# Patient Record
Sex: Female | Born: 1990 | Hispanic: Yes | Marital: Single | Smoking: Never smoker
Health system: Southern US, Community
[De-identification: ages and names within clinical notes are randomized; demographics above are authoritative.]

## PROBLEM LIST (undated history)

## (undated) DIAGNOSIS — F329 Major depressive disorder, single episode, unspecified: Secondary | ICD-10-CM

## (undated) DIAGNOSIS — E119 Type 2 diabetes mellitus without complications: Secondary | ICD-10-CM

## (undated) DIAGNOSIS — K219 Gastro-esophageal reflux disease without esophagitis: Secondary | ICD-10-CM

## (undated) DIAGNOSIS — F32A Depression, unspecified: Secondary | ICD-10-CM

## (undated) DIAGNOSIS — E7800 Pure hypercholesterolemia, unspecified: Secondary | ICD-10-CM

---

## 2011-03-29 ENCOUNTER — Emergency Department: Payer: Self-pay | Admitting: Emergency Medicine

## 2011-04-08 ENCOUNTER — Emergency Department: Payer: Self-pay | Admitting: Emergency Medicine

## 2011-10-19 ENCOUNTER — Emergency Department: Payer: Self-pay | Admitting: Emergency Medicine

## 2012-05-28 ENCOUNTER — Emergency Department: Payer: Self-pay | Admitting: Unknown Physician Specialty

## 2012-05-28 LAB — COMPREHENSIVE METABOLIC PANEL
Albumin: 4.1 g/dL (ref 3.4–5.0)
BUN: 11 mg/dL (ref 7–18)
Chloride: 107 mmol/L (ref 98–107)
Co2: 28 mmol/L (ref 21–32)
EGFR (African American): >60
EGFR (Non-African Amer.): >60
Glucose: 87 mg/dL (ref 65–99)
SGOT(AST): 24 U/L (ref 15–37)
SGPT (ALT): 39 U/L (ref 12–78)
Total Protein: 7.8 g/dL (ref 6.4–8.2)

## 2012-05-28 LAB — CBC
HCT: 39.6 % (ref 35.0–47.0)
HGB: 13.3 g/dL (ref 12.0–16.0)
MCHC: 33.6 g/dL (ref 32.0–36.0)
MCV: 80 fL (ref 80–100)
RBC: 4.98 10*6/uL (ref 3.80–5.20)
RDW: 15 % — ABNORMAL HIGH (ref 11.5–14.5)

## 2012-05-28 LAB — URINALYSIS, COMPLETE
Blood: NEGATIVE
Glucose,UR: NEGATIVE mg/dL (ref 0–75)
Ketone: NEGATIVE
Nitrite: NEGATIVE
Ph: 6 (ref 4.5–8.0)
Protein: NEGATIVE
Specific Gravity: 1.029 (ref 1.003–1.030)
Squamous Epithelial: 5
WBC UR: 11 /HPF (ref 0–5)

## 2012-05-28 LAB — LIPASE, BLOOD: Lipase: 117 U/L (ref 73–393)

## 2012-11-14 ENCOUNTER — Encounter (HOSPITAL_COMMUNITY): Payer: Self-pay | Admitting: *Deleted

## 2012-11-14 ENCOUNTER — Emergency Department (HOSPITAL_COMMUNITY): Payer: BC Managed Care – PPO

## 2012-11-14 ENCOUNTER — Emergency Department (HOSPITAL_COMMUNITY)
Admission: EM | Admit: 2012-11-14 | Discharge: 2012-11-14 | Disposition: A | Discharge status: Home / Self Care | Payer: BC Managed Care – PPO | Attending: Emergency Medicine | Admitting: Emergency Medicine

## 2012-11-14 DIAGNOSIS — Z3202 Encounter for pregnancy test, result negative: Secondary | ICD-10-CM | POA: Insufficient documentation

## 2012-11-14 DIAGNOSIS — Z79899 Other long term (current) drug therapy: Secondary | ICD-10-CM | POA: Insufficient documentation

## 2012-11-14 DIAGNOSIS — F3289 Other specified depressive episodes: Secondary | ICD-10-CM | POA: Insufficient documentation

## 2012-11-14 DIAGNOSIS — E119 Type 2 diabetes mellitus without complications: Secondary | ICD-10-CM | POA: Insufficient documentation

## 2012-11-14 DIAGNOSIS — F329 Major depressive disorder, single episode, unspecified: Secondary | ICD-10-CM | POA: Insufficient documentation

## 2012-11-14 DIAGNOSIS — E669 Obesity, unspecified: Secondary | ICD-10-CM | POA: Insufficient documentation

## 2012-11-14 DIAGNOSIS — K219 Gastro-esophageal reflux disease without esophagitis: Secondary | ICD-10-CM | POA: Insufficient documentation

## 2012-11-14 DIAGNOSIS — R110 Nausea: Secondary | ICD-10-CM | POA: Insufficient documentation

## 2012-11-14 DIAGNOSIS — IMO0001 Reserved for inherently not codable concepts without codable children: Secondary | ICD-10-CM

## 2012-11-14 DIAGNOSIS — E7800 Pure hypercholesterolemia, unspecified: Secondary | ICD-10-CM | POA: Insufficient documentation

## 2012-11-14 DIAGNOSIS — R109 Unspecified abdominal pain: Secondary | ICD-10-CM | POA: Insufficient documentation

## 2012-11-14 HISTORY — DX: Type 2 diabetes mellitus without complications: E11.9

## 2012-11-14 HISTORY — DX: Gastro-esophageal reflux disease without esophagitis: K21.9

## 2012-11-14 HISTORY — DX: Pure hypercholesterolemia, unspecified: E78.00

## 2012-11-14 HISTORY — DX: Major depressive disorder, single episode, unspecified: F32.9

## 2012-11-14 HISTORY — DX: Depression, unspecified: F32.A

## 2012-11-14 LAB — COMPREHENSIVE METABOLIC PANEL
ALT: 48 U/L — ABNORMAL HIGH (ref 0–35)
Alkaline Phosphatase: 75 U/L (ref 39–117)
CO2: 24 mEq/L (ref 19–32)
Calcium: 10.2 mg/dL (ref 8.4–10.5)
Chloride: 103 mEq/L (ref 96–112)
GFR calc Af Amer: >90 mL/min (ref >90)
GFR calc non Af Amer: >90 mL/min (ref >90)
Glucose, Bld: 84 mg/dL (ref 70–99)
Potassium: 4 mEq/L (ref 3.5–5.1)
Sodium: 138 mEq/L (ref 135–145)
Total Bilirubin: 0.3 mg/dL (ref 0.3–1.2)

## 2012-11-14 LAB — URINE MICROSCOPIC-ADD ON

## 2012-11-14 LAB — URINALYSIS, ROUTINE W REFLEX MICROSCOPIC
Bilirubin Urine: NEGATIVE
Hgb urine dipstick: NEGATIVE
Ketones, ur: NEGATIVE mg/dL
Nitrite: NEGATIVE
Protein, ur: NEGATIVE mg/dL
Urobilinogen, UA: 0.2 mg/dL (ref 0.0–1.0)

## 2012-11-14 MED ORDER — GI COCKTAIL ~~LOC~~
30.0000 mL | Freq: Once | ORAL | Status: AC
  Administered 2012-11-14: 30 mL via ORAL
  Filled 2012-11-14 (×2): qty 30

## 2012-11-14 MED ORDER — FAMOTIDINE 20 MG PO TABS
20.0000 mg | ORAL_TABLET | Freq: Once | ORAL | Status: AC
  Administered 2012-11-14: 20 mg via ORAL
  Filled 2012-11-14: qty 1

## 2012-11-14 NOTE — ED Provider Notes (Signed)
History     CSN: [id number]  Arrival date & time 11/14/12  1300   First MD Initiated Contact with Patient 11/14/12 1329      Chief Complaint  Patient presents with  . Gastrophageal Reflux    (Consider location/radiation/quality/duration/timing/severity/associated sxs/prior treatment) The history is provided by the patient.  Rachael Bell is a 22 y.o. female history of diabetes, gastric reflux here presenting with worsening reflux symptoms. She was diagnosed with gastric reflux of month ago and was started on a medicine but she said didn't help so she stopped taking it. The last month she been having pain when she swallows and she felt nauseous after she eats. She felt like she couldn't vomit but she did not vomit. She also felt that the food is not going down as easily and now she can be spicy food. Denies any fevers or chills or constipation diarrhea or urinary symptoms. Never had GI workup in the past.    Past Medical History  Diagnosis Date  . GERD (gastroesophageal reflux disease)   . Diabetes mellitus without complication     non-insulin dependant  . Hypercholesteremia   . Depression     History reviewed. No pertinent past surgical history.  No family history on file.  History  Substance Use Topics  . Smoking status: Never Smoker   . Smokeless tobacco: Not on file  . Alcohol Use: No    OB History   Grav Para Term Preterm Abortions TAB SAB Ect Mult Living                  Review of Systems  Gastrointestinal: Positive for nausea and abdominal pain.  All other systems reviewed and are negative.    Allergies  Review of patient's allergies indicates no known allergies.  Home Medications   Current Outpatient Rx  Name  Route  Sig  Dispense  Refill  . metFORMIN (GLUCOPHAGE) 500 MG tablet   Oral   Take 500 mg by mouth 2 (two) times daily with a meal.         . simvastatin (ZOCOR) 20 MG tablet   Oral   Take 20 mg by mouth every evening.            BP 120/69  Pulse 73  Temp(Src) 98.3 F (36.8 C) (Oral)  SpO2 97%  LMP 10/31/2012  Physical Exam  Nursing note and vitals reviewed. Constitutional: She is oriented to person, place, and time. She appears well-developed and well-nourished.  obese  HENT:  Head: Normocephalic.  Mouth/Throat: Oropharynx is clear and moist.  Eyes: Conjunctivae are normal. Pupils are equal, round, and reactive to light.  Neck: Normal range of motion. Neck supple.  Cardiovascular: Normal rate, regular rhythm and normal heart sounds.   Pulmonary/Chest: Effort normal and breath sounds normal. No respiratory distress. She has no wheezes. She has no rales.  Abdominal: Soft. Bowel sounds are normal. She exhibits no distension. There is no tenderness. There is no rebound.  Musculoskeletal: Normal range of motion.  Neurological: She is alert and oriented to person, place, and time.  Skin: Skin is warm and dry.  Psychiatric: She has a normal mood and affect. Her behavior is normal. Judgment and thought content normal.    ED Course  Procedures (including critical care time)  Labs Reviewed  COMPREHENSIVE METABOLIC PANEL - Abnormal; Notable for the following:    Creatinine, Ser 0.45 (*)    AST 38 (*)    ALT 48 (*)    All  other components within normal limits  URINALYSIS, ROUTINE W REFLEX MICROSCOPIC - Abnormal; Notable for the following:    APPearance CLOUDY (*)    Leukocytes, UA MODERATE (*)    All other components within normal limits  PREGNANCY, URINE  LIPASE, BLOOD  URINE MICROSCOPIC-ADD ON  LIPASE, BLOOD  CBC WITH DIFFERENTIAL   Dg Neck Soft Tissue  11/14/2012  *RADIOLOGY REPORT*  Clinical Data: Dysphasia.  NECK SOFT TISSUES - 1+ VIEW  Comparison: No priors.  Findings: AP and lateral soft tissue views of the neck demonstrate a normal appearance of the hypopharynx, epiglottis and subglottic airway.  Prevertebral soft tissues are normal.  IMPRESSION: 1.  No acute radiographic abnormality of the  soft tissues of the neck to account for the patient's symptoms.   Original Report Authenticated By: Trudie Reed, M.D.    Dg Chest 2 View  11/14/2012  *RADIOLOGY REPORT*  Clinical Data: Dysphagia  CHEST - 2 VIEW  Comparison: None.  Findings:  Lungs clear.  Heart size and pulmonary vascularity are normal.  No adenopathy.  No bone lesions.  No pneumothorax.  IMPRESSION: No abnormality noted.   Original Report Authenticated By: Bretta Bang, M.D.      1. Reflux       MDM  Rachael Bell is a 22 y.o. female here with nausea worse with eating. Likely reflux. No abdominal tenderness. Will check labs and give GI cocktail and reassess.   3:21 PM Felt well. Still feels tough time swallowing but able to swallow. Xrays nl. LFTs mildly elevated but abdomen nontender. She has PMD f/u tomorrow. I told her that she needs to see GI for endoscopy.         Richardean Canal, MD 11/14/12 [phone]

## 2012-11-14 NOTE — ED Notes (Signed)
Patient transported to X-ray 

## 2012-11-14 NOTE — ED Notes (Signed)
Pt with hx of acid reflux to ED c/o sob and throat/sternal pain every time she eats x 1 month.  This last week she feels like she is going to choke or aspirate every time she eats.

## 2013-05-12 ENCOUNTER — Encounter (HOSPITAL_COMMUNITY): Payer: Self-pay | Admitting: Emergency Medicine

## 2013-05-12 ENCOUNTER — Emergency Department (HOSPITAL_COMMUNITY)
Admission: EM | Admit: 2013-05-12 | Discharge: 2013-05-12 | Disposition: A | Discharge status: Home / Self Care | Payer: BC Managed Care – PPO | Attending: Emergency Medicine | Admitting: Emergency Medicine

## 2013-05-12 DIAGNOSIS — Z79899 Other long term (current) drug therapy: Secondary | ICD-10-CM | POA: Insufficient documentation

## 2013-05-12 DIAGNOSIS — Z3202 Encounter for pregnancy test, result negative: Secondary | ICD-10-CM | POA: Insufficient documentation

## 2013-05-12 DIAGNOSIS — E119 Type 2 diabetes mellitus without complications: Secondary | ICD-10-CM | POA: Insufficient documentation

## 2013-05-12 DIAGNOSIS — R51 Headache: Secondary | ICD-10-CM | POA: Insufficient documentation

## 2013-05-12 DIAGNOSIS — R519 Headache, unspecified: Secondary | ICD-10-CM

## 2013-05-12 DIAGNOSIS — E7800 Pure hypercholesterolemia, unspecified: Secondary | ICD-10-CM | POA: Insufficient documentation

## 2013-05-12 DIAGNOSIS — K219 Gastro-esophageal reflux disease without esophagitis: Secondary | ICD-10-CM | POA: Insufficient documentation

## 2013-05-12 DIAGNOSIS — Z8659 Personal history of other mental and behavioral disorders: Secondary | ICD-10-CM | POA: Insufficient documentation

## 2013-05-12 DIAGNOSIS — R42 Dizziness and giddiness: Secondary | ICD-10-CM | POA: Insufficient documentation

## 2013-05-12 LAB — URINALYSIS, ROUTINE W REFLEX MICROSCOPIC
Bilirubin Urine: NEGATIVE
Glucose, UA: NEGATIVE mg/dL
Hgb urine dipstick: NEGATIVE
Ketones, ur: NEGATIVE mg/dL
Nitrite: NEGATIVE
Protein, ur: NEGATIVE mg/dL
Specific Gravity, Urine: 1.014 (ref 1.005–1.030)
Urobilinogen, UA: 0.2 mg/dL (ref 0.0–1.0)
pH: 7.5 (ref 5.0–8.0)

## 2013-05-12 LAB — POCT I-STAT, CHEM 8
Creatinine, Ser: 0.6 mg/dL (ref 0.50–1.10)
Glucose, Bld: 110 mg/dL — ABNORMAL HIGH (ref 70–99)
HCT: 41 % (ref 36.0–46.0)
Hemoglobin: 13.9 g/dL (ref 12.0–15.0)
Potassium: 3.6 mEq/L (ref 3.5–5.1)
Sodium: 141 mEq/L (ref 135–145)
TCO2: 27 mmol/L (ref 0–100)

## 2013-05-12 LAB — URINE MICROSCOPIC-ADD ON

## 2013-05-12 MED ORDER — KETOROLAC TROMETHAMINE 60 MG/2ML IM SOLN
60.0000 mg | Freq: Once | INTRAMUSCULAR | Status: DC
  Filled 2013-05-12: qty 2

## 2013-05-12 MED ORDER — IBUPROFEN 800 MG PO TABS
800.0000 mg | ORAL_TABLET | Freq: Once | ORAL | Status: AC
  Administered 2013-05-12: 800 mg via ORAL
  Filled 2013-05-12: qty 1

## 2013-05-12 MED ORDER — IBUPROFEN 800 MG PO TABS: 800.0000 mg | ORAL_TABLET | Freq: Three times a day (TID) | ORAL | Status: DC | PRN

## 2013-05-12 NOTE — ED Notes (Signed)
Pt reports headaches on and off the last week. States she has pressure in the top of her head. Also reports dizziness while working. Pt also c/o pain in upper back. Pt awake, alert, oriented x4, neuro intact.

## 2013-05-12 NOTE — ED Provider Notes (Signed)
Medical screening examination/treatment/procedure(s) were performed by non-physician practitioner and as supervising physician I was immediately available for consultation/collaboration.  Darlys Gales, MD 05/12/13 2228

## 2013-05-12 NOTE — ED Provider Notes (Signed)
CSN: [id number]     Arrival date & time 05/12/13  1140 History     First MD Initiated Contact with Patient 05/12/13 1159     Chief Complaint  Patient presents with  . Headache  . Dizziness   (Consider location/radiation/quality/duration/timing/severity/associated sxs/prior Treatment) HPI Patient presents emergency department with off-and-on headache for the last 7 days.  Patient, states, that she has headache, with bilateral and nonradiating, states, at, times she'll have pain, and headache behind both eyes.  Patient denies blurred vision, weakness, dizziness, neck pain, numbness, chest pain, shortness of breath, fever, abdominal pain, or syncope.  Patient, states, that is not normally, headaches, has had headaches in the past.  Patient denies photophobia.  Patient, states nothing seems to make her condition, better or worse. Past Medical History  Diagnosis Date  . GERD (gastroesophageal reflux disease)   . Diabetes mellitus without complication     non-insulin dependant  . Hypercholesteremia   . Depression    History reviewed. No pertinent past surgical history. History reviewed. No pertinent family history. History  Substance Use Topics  . Smoking status: Never Smoker   . Smokeless tobacco: Not on file  . Alcohol Use: No   OB History   Grav Para Term Preterm Abortions TAB SAB Ect Mult Living                 Review of Systems All other systems negative except as documented in the HPI. All pertinent positives and negatives as reviewed in the HPI. Allergies  Review of patient's allergies indicates no known allergies.  Home Medications   Current Outpatient Rx  Name  Route  Sig  Dispense  Refill  . metFORMIN (GLUCOPHAGE) 500 MG tablet   Oral   Take 500 mg by mouth 2 (two) times daily with a meal.         . pantoprazole (PROTONIX) 40 MG tablet   Oral   Take 40 mg by mouth daily.         . simvastatin (ZOCOR) 20 MG tablet   Oral   Take 20 mg by mouth every  evening.          BP 114/65  Pulse 92  Temp(Src) 98.3 F (36.8 C) (Oral)  Resp 16  Wt 174 lb (78.926 kg)  SpO2 98%  LMP 04/28/2013 Physical Exam  Constitutional: She is oriented to person, place, and time. She appears well-developed and well-nourished. No distress.  HENT:  Head: Normocephalic and atraumatic.  Mouth/Throat: Oropharynx is clear and moist.  Eyes: Pupils are equal, round, and reactive to light.  Neck: Normal range of motion. Neck supple.  Cardiovascular: Normal rate, regular rhythm and normal heart sounds.  Exam reveals no gallop and no friction rub.   No murmur heard. Pulmonary/Chest: Effort normal and breath sounds normal. No respiratory distress.  Lymphadenopathy:    She has no cervical adenopathy.  Neurological: She is alert and oriented to person, place, and time. She has normal strength. No cranial nerve deficit or sensory deficit. She exhibits normal muscle tone. Coordination and gait normal. GCS eye subscore is 4. GCS verbal subscore is 5. GCS motor subscore is 6.  Skin: Skin is warm and dry.    ED Course   Procedures (including critical care time)  Labs Reviewed  URINALYSIS, ROUTINE W REFLEX MICROSCOPIC - Abnormal; Notable for the following:    Leukocytes, UA TRACE (*)    All other components within normal limits  URINE MICROSCOPIC-ADD ON - Abnormal; Notable for  the following:    Squamous Epithelial / LPF FEW (*)    All other components within normal limits  POCT I-STAT, CHEM 8 - Abnormal; Notable for the following:    BUN 5 (*)    Glucose, Bld 110 (*)    Calcium, Ion 1.24 (*)    All other components within normal limits  POCT PREGNANCY, URINE   patient does not have any neurological deficits on exam.  She is currently not having any headache in the emergency department.  I advised the patient.  Followup with her primary care Dr. for further evaluation and basic lab tests were normal here today  MDM    Carlyle Dolly, PA-C 05/12/13  1451

## 2013-07-13 ENCOUNTER — Encounter (HOSPITAL_COMMUNITY): Payer: Self-pay | Admitting: Emergency Medicine

## 2013-07-13 ENCOUNTER — Emergency Department (HOSPITAL_COMMUNITY)
Admission: EM | Admit: 2013-07-13 | Discharge: 2013-07-13 | Disposition: A | Discharge status: Home / Self Care | Payer: BC Managed Care – PPO | Attending: Emergency Medicine | Admitting: Emergency Medicine

## 2013-07-13 DIAGNOSIS — Z79899 Other long term (current) drug therapy: Secondary | ICD-10-CM | POA: Insufficient documentation

## 2013-07-13 DIAGNOSIS — Z8719 Personal history of other diseases of the digestive system: Secondary | ICD-10-CM | POA: Insufficient documentation

## 2013-07-13 DIAGNOSIS — E7800 Pure hypercholesterolemia, unspecified: Secondary | ICD-10-CM | POA: Insufficient documentation

## 2013-07-13 DIAGNOSIS — L509 Urticaria, unspecified: Secondary | ICD-10-CM

## 2013-07-13 DIAGNOSIS — Z8659 Personal history of other mental and behavioral disorders: Secondary | ICD-10-CM | POA: Insufficient documentation

## 2013-07-13 DIAGNOSIS — E119 Type 2 diabetes mellitus without complications: Secondary | ICD-10-CM | POA: Insufficient documentation

## 2013-07-13 MED ORDER — HYDROCORTISONE 1 % EX CREA: TOPICAL_CREAM | CUTANEOUS | Status: DC

## 2013-07-13 MED ORDER — FAMOTIDINE 20 MG PO TABS
20.0000 mg | ORAL_TABLET | Freq: Once | ORAL | Status: AC
  Administered 2013-07-13: 20 mg via ORAL
  Filled 2013-07-13: qty 1

## 2013-07-13 MED ORDER — DIPHENHYDRAMINE HCL 25 MG PO CAPS
25.0000 mg | ORAL_CAPSULE | Freq: Once | ORAL | Status: AC
  Administered 2013-07-13: 25 mg via ORAL
  Filled 2013-07-13: qty 1

## 2013-07-13 MED ORDER — LORATADINE 10 MG PO TABS: 10.0000 mg | ORAL_TABLET | Freq: Every day | ORAL | Status: DC

## 2013-07-13 MED ORDER — DIPHENHYDRAMINE HCL 25 MG PO TABS: 25.0000 mg | ORAL_TABLET | Freq: Four times a day (QID) | ORAL | Status: DC

## 2013-07-13 MED ORDER — LORATADINE 10 MG PO TABS
10.0000 mg | ORAL_TABLET | Freq: Once | ORAL | Status: AC
  Administered 2013-07-13: 10 mg via ORAL
  Filled 2013-07-13: qty 1

## 2013-07-13 NOTE — ED Provider Notes (Signed)
Medical screening examination/treatment/procedure(s) were performed by non-physician practitioner and as supervising physician I was immediately available for consultation/collaboration.   Layla Maw Ward, DO 07/13/13 [phone]

## 2013-07-13 NOTE — ED Notes (Signed)
Pt. reports generalized itchy rashes onset yesterday , respirations unlabored /airway intact.

## 2013-07-13 NOTE — ED Provider Notes (Signed)
CSN: [id number]     Arrival date & time 07/13/13  0013 History   First MD Initiated Contact with Patient 07/13/13 0052     Chief Complaint  Patient presents with  . Rash   (Consider location/radiation/quality/duration/timing/severity/associated sxs/prior Treatment) HPI  Rachael Bell is a 22 y.o.female with a significant PMH of gerd, diabetes, depression and high cholesterol presents to the ER with complaints of rash to arms chest and abdomen. Patient woke up this morning noticing the rash which is itchy. She went to work without any problems. When her husband saw the rash this evening he advised her to come to the emergency department to get evaluated. It does not hurt but feels itchy. He says that she has had allergic reaction rash like this before but a long time ago to poison ivy. She is unsure of what she is allergic to. He has not had any wheezing, shortness of breath, facial, tongue or lip swelling. She has not tried to use any medication for the rash. nad vss   Past Medical History  Diagnosis Date  . GERD (gastroesophageal reflux disease)   . Diabetes mellitus without complication     non-insulin dependant  . Hypercholesteremia   . Depression    History reviewed. No pertinent past surgical history. No family history on file. History  Substance Use Topics  . Smoking status: Never Smoker   . Smokeless tobacco: Not on file  . Alcohol Use: No   OB History   Grav Para Term Preterm Abortions TAB SAB Ect Mult Living                 Review of Systems  Review of Systems  Gen: no weight loss, fevers, chills, night sweats  Eyes: no discharge or drainage, no occular pain or visual changes  Nose: no epistaxis or rhinorrhea  Mouth: no dental pain, no sore throat  Neck: no neck pain  Lungs:No wheezing, coughing or hemoptysis CV: no chest pain, palpitations, dependent edema or orthopnea  Abd: no abdominal pain, nausea, vomiting  GU: no dysuria or gross hematuria  MSK:  No  abnormalities  Neuro: no headache, no focal neurologic deficits  Skin: + rash Psyche: negative.    Allergies  Review of patient's allergies indicates no known allergies.  Home Medications   Current Outpatient Rx  Name  Route  Sig  Dispense  Refill  . butalbital-acetaminophen-caffeine (FIORICET, ESGIC) 50-325-40 MG per tablet   Oral   Take 1 tablet by mouth every 4 (four) hours as needed for headache.         . metFORMIN (GLUCOPHAGE) 500 MG tablet   Oral   Take 500 mg by mouth 2 (two) times daily with a meal.         . simvastatin (ZOCOR) 20 MG tablet   Oral   Take 20 mg by mouth every evening.         . diphenhydrAMINE (BENADRYL) 25 MG tablet   Oral   Take 1 tablet (25 mg total) by mouth every 6 (six) hours.   20 tablet   0   . hydrocortisone cream 1 %      Apply to affected area 2 times daily   15 g   0   . loratadine (CLARITIN) 10 MG tablet   Oral   Take 1 tablet (10 mg total) by mouth daily.   14 tablet   0    BP 111/66  Pulse 82  Temp(Src) 98.8 F (37.1 C) (Oral)  Resp 16  Ht 5\' 3"  (1.6 m)  Wt 186 lb (84.369 kg)  BMI 32.96 kg/m2  SpO2 99%  LMP 06/17/2013 Physical Exam  Nursing note and vitals reviewed. Constitutional: She appears well-developed and well-nourished. No distress.  HENT:  Head: Normocephalic and atraumatic.  Mouth/Throat: Uvula is midline, oropharynx is clear and moist and mucous membranes are normal.  Eyes: Pupils are equal, round, and reactive to light.  Neck: Normal range of motion. Neck supple.  Cardiovascular: Normal rate and regular rhythm.   Pulmonary/Chest: Effort normal.  Abdominal: Soft.  Neurological: She is alert.  Skin: Skin is warm and dry. Rash noted. Rash is urticarial.       ED Course  Procedures (including critical care time) Labs Review Labs Reviewed - No data to display Imaging Review No results found.  EKG Interpretation   None       MDM   1. Hives      Due to patient being a  diabetic I will default to topical hydrocortisone cream instead of prednisone. Symptoms are mild with systemic involvement.  diphenhydrAMINE (BENADRYL) 25 MG tablet Take 1 tablet (25 mg total) by mouth every 6 (six) hours. 20 tablet Dorthula Matas, PA-C hydrocortisone cream 1 % Apply to affected area 2 times daily 15 g Sherica Paternostro G Zyaira Vejar, PA-C loratadine (CLARITIN) 10 MG tablet Take 1 tablet (10 mg total) by mouth daily. 14 tablet  22 y.o.[name] evaluation in the Emergency Department is complete. It has been determined that no acute conditions requiring further emergency intervention are present at this time. The patient/guardian have been advised of the diagnosis and plan. We have discussed signs and symptoms that warrant return to the ED, such as changes or worsening in symptoms.  Vital signs are stable at discharge. Filed Vitals:   07/13/13 0025  BP: 111/66  Pulse: 82  Temp: 98.8 F (37.1 C)  Resp: 16    Patient/guardian has voiced understanding and agreed to follow-up with the PCP or specialist.     Dorthula Matas, PA-C 07/13/13 0145

## 2013-07-19 ENCOUNTER — Encounter (HOSPITAL_COMMUNITY): Payer: Self-pay | Admitting: Emergency Medicine

## 2013-07-19 ENCOUNTER — Emergency Department (HOSPITAL_COMMUNITY)
Admission: EM | Admit: 2013-07-19 | Discharge: 2013-07-19 | Disposition: A | Discharge status: Home / Self Care | Payer: BC Managed Care – PPO | Attending: Emergency Medicine | Admitting: Emergency Medicine

## 2013-07-19 DIAGNOSIS — R5381 Other malaise: Secondary | ICD-10-CM | POA: Insufficient documentation

## 2013-07-19 DIAGNOSIS — R29898 Other symptoms and signs involving the musculoskeletal system: Secondary | ICD-10-CM

## 2013-07-19 DIAGNOSIS — Z8659 Personal history of other mental and behavioral disorders: Secondary | ICD-10-CM | POA: Insufficient documentation

## 2013-07-19 DIAGNOSIS — Z79899 Other long term (current) drug therapy: Secondary | ICD-10-CM | POA: Insufficient documentation

## 2013-07-19 DIAGNOSIS — E7800 Pure hypercholesterolemia, unspecified: Secondary | ICD-10-CM | POA: Insufficient documentation

## 2013-07-19 DIAGNOSIS — Z8719 Personal history of other diseases of the digestive system: Secondary | ICD-10-CM | POA: Insufficient documentation

## 2013-07-19 DIAGNOSIS — E119 Type 2 diabetes mellitus without complications: Secondary | ICD-10-CM | POA: Insufficient documentation

## 2013-07-19 LAB — CBC WITH DIFFERENTIAL/PLATELET
Basophils Relative: 0 % (ref 0–1)
Eosinophils Absolute: 0 10*3/uL (ref 0.0–0.7)
Hemoglobin: 12.2 g/dL (ref 12.0–15.0)
Lymphocytes Relative: 26 % (ref 12–46)
Lymphs Abs: 1.4 10*3/uL (ref 0.7–4.0)
MCH: 25.6 pg — ABNORMAL LOW (ref 26.0–34.0)
MCV: 77.7 fL — ABNORMAL LOW (ref 78.0–100.0)
Monocytes Relative: 7 % (ref 3–12)
Neutrophils Relative %: 66 % (ref 43–77)
Platelets: 175 10*3/uL (ref 150–400)
RBC: 4.76 MIL/uL (ref 3.87–5.11)
WBC: 5.4 10*3/uL (ref 4.0–10.5)

## 2013-07-19 LAB — POCT I-STAT, CHEM 8
BUN: 7 mg/dL (ref 6–23)
Creatinine, Ser: 0.8 mg/dL (ref 0.50–1.10)
Glucose, Bld: 96 mg/dL (ref 70–99)
HCT: 39 % (ref 36.0–46.0)
Hemoglobin: 13.3 g/dL (ref 12.0–15.0)
Potassium: 3.7 mEq/L (ref 3.5–5.1)
Sodium: 143 mEq/L (ref 135–145)

## 2013-07-19 MED ORDER — ALPRAZOLAM 0.25 MG PO TABS
0.5000 mg | ORAL_TABLET | Freq: Once | ORAL | Status: AC
  Administered 2013-07-19: 0.5 mg via ORAL
  Filled 2013-07-19: qty 2

## 2013-07-19 MED ORDER — ALPRAZOLAM 0.5 MG PO TABS: 0.5000 mg | ORAL_TABLET | Freq: Every evening | ORAL | Status: DC | PRN

## 2013-07-19 NOTE — ED Provider Notes (Signed)
CSN: [id number]     Arrival date & time 07/19/13  1141 History   None    Chief Complaint  Patient presents with  . Headache   (Consider location/radiation/quality/duration/timing/severity/associated sxs/prior Treatment) Patient is a 22 y.o. female presenting with headaches. No language interpreter was used.  Headache Pain location:  L temporal and R temporal Quality:  Dull Radiates to:  Does not radiate Onset quality:  Gradual Associated symptoms: weakness   Associated symptoms: no congestion, no cough, no fever, no myalgias, no numbness and no paresthesias   Weakness:    Severity:  Mild   Onset quality:  Gradual   Duration:  2 days   Timing:  Intermittent Risk factors: lifestyle not sedentary   Pt is a 22 year old female who presents with a history of tension headaches and overall feeling of tiredness and weakness in her legs. She has had some follow-up for her headaches and a recent MRI. 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 She also reports that she has not been eating consistent meals just because she has been so busy. She takes her Metformin in the morning with breakfast because it sometimes causes her some GI upset. She denies chest pain, shortness of breath or difficulty breathing. She denies history of recent illness or sick exposure. No fever, chills, nausea or vomiting. She denies having a headache at this time.    Past Medical History  Diagnosis Date  . GERD (gastroesophageal reflux disease)   . Diabetes mellitus without complication     non-insulin dependant  . Hypercholesteremia   . Depression    History reviewed. No pertinent past surgical history. History reviewed. No pertinent family history. History  Substance Use Topics  . Smoking status: Never Smoker   . Smokeless tobacco: Not on file  . Alcohol Use: No   OB History   Grav Para Term Preterm Abortions TAB SAB Ect  Mult Living                 Review of Systems  Constitutional: Negative for fever and chills.  HENT: Negative for congestion.   Respiratory: Negative for cough, chest tightness, shortness of breath and wheezing.   Musculoskeletal: Negative for gait problem, joint swelling and myalgias.  Neurological: Positive for headaches. Negative for weakness, light-headedness, numbness and paresthesias.  All other systems reviewed and are negative.    Allergies  Review of patient's allergies indicates no known allergies.  Home Medications   Current Outpatient Rx  Name  Route  Sig  Dispense  Refill  . butalbital-acetaminophen-caffeine (FIORICET, ESGIC) 50-325-40 MG per tablet   Oral   Take 1 tablet by mouth every 4 (four) hours as needed for headache.         . levonorgestrel (MIRENA) 20 MCG/24HR IUD   Intrauterine   1 each by Intrauterine route once.         . metFORMIN (GLUCOPHAGE) 500 MG tablet   Oral   Take 500 mg by mouth 2 (two) times daily with a meal.         . simvastatin (ZOCOR) 20 MG tablet   Oral   Take 20 mg by mouth every evening.          BP 138/80  Pulse 104  Temp(Src) 98.7 F (37.1 C) (Oral)  Resp 20  Wt 186 lb (84.369 kg)  BMI 32.96 kg/m2  SpO2 100%  LMP  06/17/2013 Physical Exam  Nursing note and vitals reviewed. Constitutional: She is oriented to person, place, and time. She appears well-developed and well-nourished. No distress.  HENT:  Head: Normocephalic and atraumatic.  Right Ear: External ear normal.  Left Ear: External ear normal.  Mouth/Throat: Oropharynx is clear and moist.  Eyes: Conjunctivae and EOM are normal. Pupils are equal, round, and reactive to light.  Neck: Normal range of motion. Neck supple. No JVD present. No thyromegaly present.  Cardiovascular: Normal rate, regular rhythm, normal heart sounds and intact distal pulses.   Pulmonary/Chest: Effort normal and breath sounds normal. No respiratory distress. She has no wheezes.   Abdominal: Soft. Bowel sounds are normal. She exhibits no distension. There is no tenderness.  Musculoskeletal: Normal range of motion.  Lymphadenopathy:    She has no cervical adenopathy.  Neurological: She is alert and oriented to person, place, and time. She has normal strength. No cranial nerve deficit or sensory deficit. She displays a negative Romberg sign. Coordination and gait normal. GCS eye subscore is 4. GCS verbal subscore is 5. GCS motor subscore is 6.  Skin: Skin is warm and dry.  Psychiatric: She has a normal mood and affect. Her behavior is normal. Judgment and thought content normal.    ED Course  Procedures (including critical care time) Labs Review Labs Reviewed - No data to display Imaging Review No results found.  EKG Interpretation   None       MDM   1. Leg weakness    Pt has a job where she stands hunched over for long periods of time because of the nature of the work that she does. She reports that she feels like some of her leg weakness may be due to fatigue from standing for long periods in the same position. Lab work was unremarkable. No numbness, tingling or disruptions in her gait. She also has tension headaches that are believed to be posiional and related to her work. Encouraged to do stretching exercises, warm compresses and frequent change of position to avoid fatigue and muscle spasm.      Irish Elders, NP 07/19/13 1515

## 2013-07-19 NOTE — ED Notes (Signed)
Pt sts that she has been standing constantly at work this past week and wonders if this could be from that.

## 2013-07-19 NOTE — ED Notes (Signed)
Pt in c/o headache since last night and also bilateral leg weakness, pt ambulatory to room, pt states she feels more tired, pt has been recently evaluated for same and had MRI that was normal, pt is supposed to follow up with her PMD

## 2013-07-19 NOTE — ED Notes (Signed)
Pt dc to home. Pt sts understanding to dc instructions. Pt ambulatory to exit without difficulty. 

## 2013-07-19 NOTE — ED Notes (Signed)
Pt check: pt sts feeling much better.  md aware.

## 2013-07-22 NOTE — ED Provider Notes (Signed)
Medical screening examination/treatment/procedure(s) were performed by non-physician practitioner and as supervising physician I was immediately available for consultation/collaboration.  EKG Interpretation   None         Philander Ake M August Gosser, DO 07/22/13 1139 

## 2013-08-19 ENCOUNTER — Encounter (HOSPITAL_COMMUNITY): Payer: Self-pay | Admitting: Emergency Medicine

## 2013-08-19 ENCOUNTER — Emergency Department (HOSPITAL_COMMUNITY)
Admission: EM | Admit: 2013-08-19 | Discharge: 2013-08-19 | Disposition: A | Discharge status: Home / Self Care | Payer: BC Managed Care – PPO | Attending: Emergency Medicine | Admitting: Emergency Medicine

## 2013-08-19 DIAGNOSIS — Z79899 Other long term (current) drug therapy: Secondary | ICD-10-CM | POA: Insufficient documentation

## 2013-08-19 DIAGNOSIS — R110 Nausea: Secondary | ICD-10-CM | POA: Insufficient documentation

## 2013-08-19 DIAGNOSIS — Z8719 Personal history of other diseases of the digestive system: Secondary | ICD-10-CM | POA: Insufficient documentation

## 2013-08-19 DIAGNOSIS — R519 Headache, unspecified: Secondary | ICD-10-CM

## 2013-08-19 DIAGNOSIS — E7800 Pure hypercholesterolemia, unspecified: Secondary | ICD-10-CM | POA: Insufficient documentation

## 2013-08-19 DIAGNOSIS — R42 Dizziness and giddiness: Secondary | ICD-10-CM | POA: Insufficient documentation

## 2013-08-19 DIAGNOSIS — R51 Headache: Secondary | ICD-10-CM | POA: Insufficient documentation

## 2013-08-19 DIAGNOSIS — E119 Type 2 diabetes mellitus without complications: Secondary | ICD-10-CM | POA: Insufficient documentation

## 2013-08-19 DIAGNOSIS — Z8659 Personal history of other mental and behavioral disorders: Secondary | ICD-10-CM | POA: Insufficient documentation

## 2013-08-19 DIAGNOSIS — Z3202 Encounter for pregnancy test, result negative: Secondary | ICD-10-CM | POA: Insufficient documentation

## 2013-08-19 LAB — URINALYSIS, ROUTINE W REFLEX MICROSCOPIC
Ketones, ur: NEGATIVE mg/dL
Leukocytes, UA: NEGATIVE
Nitrite: NEGATIVE
pH: 6.5 (ref 5.0–8.0)

## 2013-08-19 LAB — COMPREHENSIVE METABOLIC PANEL
Albumin: 3.9 g/dL (ref 3.5–5.2)
Alkaline Phosphatase: 90 U/L (ref 39–117)
BUN: 13 mg/dL (ref 6–23)
CO2: 26 mEq/L (ref 19–32)
Calcium: 9.2 mg/dL (ref 8.4–10.5)
Creatinine, Ser: 0.54 mg/dL (ref 0.50–1.10)
GFR calc Af Amer: >90 mL/min (ref >90)
Glucose, Bld: 109 mg/dL — ABNORMAL HIGH (ref 70–99)
Potassium: 3.7 mEq/L (ref 3.5–5.1)
Total Protein: 7.4 g/dL (ref 6.0–8.3)

## 2013-08-19 LAB — CBC WITH DIFFERENTIAL/PLATELET
Basophils Relative: 0 % (ref 0–1)
Eosinophils Absolute: 0.1 10*3/uL (ref 0.0–0.7)
Eosinophils Relative: 1 % (ref 0–5)
HCT: 35.7 % — ABNORMAL LOW (ref 36.0–46.0)
Hemoglobin: 12.2 g/dL (ref 12.0–15.0)
Lymphs Abs: 2.2 10*3/uL (ref 0.7–4.0)
MCH: 26.5 pg (ref 26.0–34.0)
MCHC: 34.2 g/dL (ref 30.0–36.0)
MCV: 77.6 fL — ABNORMAL LOW (ref 78.0–100.0)
Monocytes Absolute: 0.4 10*3/uL (ref 0.1–1.0)
Monocytes Relative: 6 % (ref 3–12)
RBC: 4.6 MIL/uL (ref 3.87–5.11)

## 2013-08-19 LAB — PREGNANCY, URINE: Preg Test, Ur: NEGATIVE

## 2013-08-19 MED ORDER — IBUPROFEN 400 MG PO TABS: 600.0000 mg | ORAL_TABLET | Freq: Once | ORAL | Status: DC

## 2013-08-19 MED ORDER — IBUPROFEN 600 MG PO TABS: 600.0000 mg | ORAL_TABLET | Freq: Four times a day (QID) | ORAL | Status: DC | PRN

## 2013-08-19 NOTE — ED Provider Notes (Signed)
CSN: [id number]     Arrival date & time 08/19/13  0220 History   First MD Initiated Contact with Patient 08/19/13 0240     Chief Complaint  Patient presents with  . Headache   (Consider location/radiation/quality/duration/timing/severity/associated sxs/prior Treatment) Patient is a 22 y.o. female presenting with headaches.  Headache Associated symptoms: nausea   Associated symptoms: no abdominal pain, no back pain, no cough, no diarrhea, no dizziness, no fever, no myalgias, no neck pain, no neck stiffness, no numbness, no photophobia, no sinus pressure, no sore throat and no vomiting    Patient presents with head pressure that started gradually yesterday and worsened tonight while she was at work. Patient works reading small numbers and letters repetitively and this exacerbated her symptoms. She had mild nausea but no vomiting. She has no photophobia. She denies any fevers or chills. She denies any head trauma. She denies any neck pain or stiffness. She denies any nasal congestion, sinus pressure or sore throat. She denies any focal weakness or numbness. Patient states that since she's been in the emergency department the head pressure has resolved and she has no nausea. Patient does admit to having to strain her eyes to see a small letters while she is working. Past Medical History  Diagnosis Date  . GERD (gastroesophageal reflux disease)   . Diabetes mellitus without complication     non-insulin dependant  . Hypercholesteremia   . Depression    History reviewed. No pertinent past surgical history. No family history on file. History  Substance Use Topics  . Smoking status: Never Smoker   . Smokeless tobacco: Not on file  . Alcohol Use: No   OB History   Grav Para Term Preterm Abortions TAB SAB Ect Mult Living                 Review of Systems  Constitutional: Negative for fever and chills.  HENT: Negative for sinus pressure and sore throat.   Eyes: Negative for photophobia and  visual disturbance.  Respiratory: Negative for cough and shortness of breath.   Cardiovascular: Negative for chest pain, palpitations and leg swelling.  Gastrointestinal: Positive for nausea. Negative for vomiting, abdominal pain and diarrhea.  Genitourinary: Negative for dysuria, hematuria, flank pain, vaginal bleeding, vaginal discharge and pelvic pain.  Musculoskeletal: Negative for back pain, myalgias, neck pain and neck stiffness.  Skin: Negative for rash and wound.  Neurological: Positive for light-headedness and headaches. Negative for dizziness, syncope, weakness and numbness.    Allergies  Review of patient's allergies indicates no known allergies.  Home Medications   Current Outpatient Rx  Name  Route  Sig  Dispense  Refill  . levonorgestrel (MIRENA) 20 MCG/24HR IUD   Intrauterine   1 each by Intrauterine route once.         . simvastatin (ZOCOR) 20 MG tablet   Oral   Take 20 mg by mouth every evening.          BP 118/64  Pulse 81  Temp(Src) 98.2 F (36.8 C) (Oral)  Resp 14  SpO2 100%  LMP 08/04/2013 Physical Exam  Nursing note and vitals reviewed. Constitutional: She is oriented to person, place, and time. She appears well-developed and well-nourished. No distress.  HENT:  Head: Normocephalic and atraumatic.  Mouth/Throat: Oropharynx is clear and moist.  No sinus tenderness.  Eyes: EOM are normal. Pupils are equal, round, and reactive to light.  Neck: Normal range of motion. Neck supple.  No meningismus  Cardiovascular: Normal rate  and regular rhythm.   Pulmonary/Chest: Effort normal and breath sounds normal. No respiratory distress. She has no wheezes. She has no rales. She exhibits no tenderness.  Abdominal: Soft. Bowel sounds are normal. She exhibits no distension and no mass. There is no tenderness. There is no rebound and no guarding.  Musculoskeletal: Normal range of motion. She exhibits no edema and no tenderness.  Neurological: She is alert and  oriented to person, place, and time.  Patient is alert and oriented x3 with clear, goal oriented speech. Patient has 5/5 motor in all extremities. Sensation is intact to light touch.   Skin: Skin is warm and dry. No rash noted. No erythema.  Psychiatric: She has a normal mood and affect. Her behavior is normal.    ED Course  Procedures (including critical care time) Labs Review Labs Reviewed  CBC WITH DIFFERENTIAL  COMPREHENSIVE METABOLIC PANEL  URINALYSIS, ROUTINE W REFLEX MICROSCOPIC  PREGNANCY, URINE   Imaging Review No results found.  EKG Interpretation   None       MDM   Patient's vital signs remained stable in the emergency department. She will maintains a normal neurologic exam. She complains only of mild headache described as pressure. I've advised the patient the need to followup with her primary doctor as an outpatient for further workup should her symptoms continue. Patient also been advised to return to emergency department for worsening pain, visual changes, focal weakness, numbness or for any concerns. Patient has vocalized her understanding of this.   Loren Racer, MD 08/19/13 [phone]

## 2013-08-19 NOTE — ED Notes (Signed)
Pt. reports head " pressure" onset yesterday , denies pain , slight nausea and lightheaded , no injury. Denies fever or chills.

## 2013-08-19 NOTE — ED Notes (Signed)
The pt has a head pressure since yesterday with nausea.  Hx of headaches and she has a rx that helps but the headache returns.  She feels the pressure in her forehead

## 2013-08-19 NOTE — ED Notes (Signed)
The pt is sleeping 

## 2013-08-26 ENCOUNTER — Encounter (HOSPITAL_COMMUNITY): Payer: Self-pay | Admitting: Emergency Medicine

## 2013-08-26 ENCOUNTER — Emergency Department (HOSPITAL_COMMUNITY)
Admission: EM | Admit: 2013-08-26 | Discharge: 2013-08-26 | Disposition: A | Discharge status: Home / Self Care | Payer: BC Managed Care – PPO | Attending: Emergency Medicine | Admitting: Emergency Medicine

## 2013-08-26 DIAGNOSIS — F329 Major depressive disorder, single episode, unspecified: Secondary | ICD-10-CM | POA: Insufficient documentation

## 2013-08-26 DIAGNOSIS — B349 Viral infection, unspecified: Secondary | ICD-10-CM

## 2013-08-26 DIAGNOSIS — F3289 Other specified depressive episodes: Secondary | ICD-10-CM | POA: Insufficient documentation

## 2013-08-26 DIAGNOSIS — B9789 Other viral agents as the cause of diseases classified elsewhere: Secondary | ICD-10-CM | POA: Insufficient documentation

## 2013-08-26 DIAGNOSIS — J029 Acute pharyngitis, unspecified: Secondary | ICD-10-CM | POA: Insufficient documentation

## 2013-08-26 DIAGNOSIS — J069 Acute upper respiratory infection, unspecified: Secondary | ICD-10-CM | POA: Insufficient documentation

## 2013-08-26 DIAGNOSIS — E7800 Pure hypercholesterolemia, unspecified: Secondary | ICD-10-CM | POA: Insufficient documentation

## 2013-08-26 DIAGNOSIS — R509 Fever, unspecified: Secondary | ICD-10-CM | POA: Insufficient documentation

## 2013-08-26 DIAGNOSIS — R05 Cough: Secondary | ICD-10-CM | POA: Insufficient documentation

## 2013-08-26 DIAGNOSIS — E119 Type 2 diabetes mellitus without complications: Secondary | ICD-10-CM | POA: Insufficient documentation

## 2013-08-26 DIAGNOSIS — K219 Gastro-esophageal reflux disease without esophagitis: Secondary | ICD-10-CM | POA: Insufficient documentation

## 2013-08-26 DIAGNOSIS — J3489 Other specified disorders of nose and nasal sinuses: Secondary | ICD-10-CM | POA: Insufficient documentation

## 2013-08-26 DIAGNOSIS — R51 Headache: Secondary | ICD-10-CM | POA: Insufficient documentation

## 2013-08-26 DIAGNOSIS — R059 Cough, unspecified: Secondary | ICD-10-CM | POA: Insufficient documentation

## 2013-08-26 DIAGNOSIS — Z79899 Other long term (current) drug therapy: Secondary | ICD-10-CM | POA: Insufficient documentation

## 2013-08-26 MED ORDER — CEPHALEXIN 250 MG PO CAPS: 500.0000 mg | ORAL_CAPSULE | Freq: Four times a day (QID) | ORAL | Status: AC

## 2013-08-26 NOTE — ED Notes (Signed)
For several days, throat sore, cough, sinus pressure and ear pain. Throat is reddened; no difficulty breathing.

## 2013-08-26 NOTE — ED Provider Notes (Signed)
CSN: [id number]     Arrival date & time 08/26/13  0759 History   First MD Initiated Contact with Patient 08/26/13 [phone]     Chief Complaint  Patient presents with  . Sore Throat   (Consider location/radiation/quality/duration/timing/severity/associated sxs/prior Treatment) The history is provided by the patient. No language interpreter was used.  Rachael Bell is a 22 year old female with past history of GERD, diabetes, hypercholesterolemia, depression presenting to emergency department with sore throat that started on Wednesday. Patient reports that the sore throat is worse when swallowing fluids more so then solids. Patient reports that she had a fever on Wednesday ranging anywhere from 100-101.33F that was controlled with Tylenol-denied having fever the rest of the day. Patient reports she's been experiencing ear pressure, sinus pressure mainly to the frontal aspect with intermittent headaches. Reports she's been having nasal congestion with clear rhinorrhea. Patient reports she's been having nonproductive cough. Patient concerned since husband has been diagnosed with tonsillitis. Denied blurred vision, neck pain, neck stiffness, nausea, vomiting, diarrhea, abdominal pain, chest pain, shortness of breath, difficulty breathing. PCP Dr. Elnita Maxwell  Past Medical History  Diagnosis Date  . GERD (gastroesophageal reflux disease)   . Diabetes mellitus without complication     non-insulin dependant  . Hypercholesteremia   . Depression    History reviewed. No pertinent past surgical history. History reviewed. No pertinent family history. History  Substance Use Topics  . Smoking status: Never Smoker   . Smokeless tobacco: Not on file  . Alcohol Use: No   OB History   Grav Para Term Preterm Abortions TAB SAB Ect Mult Living                 Review of Systems  Constitutional: Positive for fever. Negative for chills.  HENT: Positive for sinus pressure and sore throat. Negative for trouble  swallowing.   Eyes: Negative for visual disturbance.  Respiratory: Positive for cough. Negative for chest tightness and shortness of breath.   Gastrointestinal: Negative for nausea, vomiting, abdominal pain and diarrhea.  Musculoskeletal: Negative for back pain and neck pain.  Neurological: Positive for headaches. Negative for dizziness and weakness.  All other systems reviewed and are negative.    Allergies  Review of patient's allergies indicates no known allergies.  Home Medications   Current Outpatient Rx  Name  Route  Sig  Dispense  Refill  . Cholecalciferol (VITAMIN D3) 10000 UNITS capsule   Oral   Take 10,000 Units by mouth daily.         Marland Kitchen levonorgestrel (MIRENA) 20 MCG/24HR IUD   Intrauterine   1 each by Intrauterine route once.         . Pseudoeph-Doxylamine-DM-APAP (NYQUIL PO)   Oral   Take 30 mLs by mouth at bedtime as needed (cold and sleep).         . simvastatin (ZOCOR) 20 MG tablet   Oral   Take 20 mg by mouth every evening.          BP 109/73  Pulse 82  Temp(Src) 97.4 F (36.3 C) (Oral)  Resp 20  SpO2 98%  LMP 08/04/2013 Physical Exam  Nursing note and vitals reviewed. Constitutional: She is oriented to person, place, and time. She appears well-developed and well-nourished. No distress.  HENT:  Head: Normocephalic and atraumatic.  Right Ear: External ear normal.  Left Ear: External ear normal.  Negative facial swelling noted Negative pain upon palpation to the maxillary and frontal sinuses  Mild swelling localized to the tonsils with  exudate. Negative erythema identified to bilateral tonsils. Negative swelling, erythema, inflammation, exudate, petechiae noted to the hard palate or posterior oropharynx. Uvula midline, symmetrical elevation.  Eyes: Conjunctivae and EOM are normal. Pupils are equal, round, and reactive to light. Right eye exhibits no discharge. Left eye exhibits no discharge.  Neck: Normal range of motion. Neck supple.   Negative neck stiffness Negative nuchal rigidity Negative cervical lymphadenopathy Negative meningeal signs  Cardiovascular: Normal rate, regular rhythm and normal heart sounds.  Exam reveals no friction rub.   No murmur heard. Pulses:      Radial pulses are 2+ on the right side, and 2+ on the left side.  Pulmonary/Chest: Effort normal and breath sounds normal. No respiratory distress. She has no wheezes. She has no rales.  Airway intact Patient able to speak in full sentences without difficulty   Musculoskeletal: Normal range of motion.  Lymphadenopathy:    She has no cervical adenopathy.  Neurological: She is alert and oriented to person, place, and time. She exhibits normal muscle tone. Coordination normal.  Skin: Skin is warm and dry. No rash noted. She is not diaphoretic. No erythema.  Psychiatric: She has a normal mood and affect. Her behavior is normal. Thought content normal.    ED Course  Procedures (including critical care time)  Results for orders placed during the hospital encounter of 08/26/13  RAPID STREP SCREEN      Result Value Range   Streptococcus, Group A Screen (Direct) NEGATIVE  NEGATIVE    Labs Review Labs Reviewed  RAPID STREP SCREEN  CULTURE, GROUP A STREP   Imaging Review No results found.  EKG Interpretation   None       MDM   1. Sore throat   2. URI (upper respiratory infection)   3. Viral illness     Filed Vitals:   08/26/13 0807  BP: 109/73  Pulse: 82  Temp: 97.4 F (36.3 C)  TempSrc: Oral  Resp: 20  SpO2: 98%   Patient presenting to the emergency department with sore throat has been ongoing since Wednesday with associated symptoms of fever on Thursday, nasal congestion, cough, sinus pressure. Alert and oriented. GCS 15. Heart rate and rhythm normal. Pulses palpable and strong, radial 2+ bilaterally. Lungs clear to auscultation bilaterally. Negative facial swelling noted. Negative pain upon palpation to maxillary and frontal  sinuses. Uvula midline, symmetrical elevation. Mild swelling to the tonsils bilaterally, exudate noted. Negative oral lesions noted. Rapid strep negative. Doubt peritonsillar abscess. Possible beginnings of strep infection. Patient stable, afebrile. Patient appears well. Discharged patient. Discharged patient with antibiotics. Referred patient to primary care provider. Discussed with patient supportive therapy. Discussed with patient to closely monitor symptoms and if symptoms are to worsen or change to report back to emergency department-strict return instructions given. Patient agreed to plan of care, understood, all questions answered.    Raymon Mutton, PA-C 08/27/13 1332

## 2013-08-27 NOTE — ED Provider Notes (Signed)
Medical screening examination/treatment/procedure(s) were performed by non-physician practitioner and as supervising physician I was immediately available for consultation/collaboration.  EKG Interpretation   None        Hurman Horn, MD 08/27/13 [phone]

## 2013-08-28 LAB — CULTURE, GROUP A STREP

## 2013-09-22 ENCOUNTER — Emergency Department (HOSPITAL_COMMUNITY)
Admission: EM | Admit: 2013-09-22 | Discharge: 2013-09-22 | Disposition: A | Discharge status: Home / Self Care | Payer: BC Managed Care – PPO

## 2013-10-05 ENCOUNTER — Encounter (HOSPITAL_COMMUNITY): Payer: Self-pay | Admitting: Emergency Medicine

## 2013-10-05 ENCOUNTER — Emergency Department (HOSPITAL_COMMUNITY)
Admission: EM | Admit: 2013-10-05 | Discharge: 2013-10-05 | Disposition: A | Discharge status: Home / Self Care | Payer: BC Managed Care – PPO | Attending: Emergency Medicine | Admitting: Emergency Medicine

## 2013-10-05 ENCOUNTER — Emergency Department (HOSPITAL_COMMUNITY): Payer: BC Managed Care – PPO

## 2013-10-05 DIAGNOSIS — K219 Gastro-esophageal reflux disease without esophagitis: Secondary | ICD-10-CM | POA: Insufficient documentation

## 2013-10-05 DIAGNOSIS — Z8659 Personal history of other mental and behavioral disorders: Secondary | ICD-10-CM | POA: Insufficient documentation

## 2013-10-05 DIAGNOSIS — E119 Type 2 diabetes mellitus without complications: Secondary | ICD-10-CM | POA: Insufficient documentation

## 2013-10-05 DIAGNOSIS — Z79899 Other long term (current) drug therapy: Secondary | ICD-10-CM | POA: Insufficient documentation

## 2013-10-05 DIAGNOSIS — E7800 Pure hypercholesterolemia, unspecified: Secondary | ICD-10-CM | POA: Insufficient documentation

## 2013-10-05 DIAGNOSIS — R109 Unspecified abdominal pain: Secondary | ICD-10-CM

## 2013-10-05 DIAGNOSIS — Z792 Long term (current) use of antibiotics: Secondary | ICD-10-CM | POA: Insufficient documentation

## 2013-10-05 DIAGNOSIS — R42 Dizziness and giddiness: Secondary | ICD-10-CM | POA: Insufficient documentation

## 2013-10-05 DIAGNOSIS — R1013 Epigastric pain: Secondary | ICD-10-CM | POA: Insufficient documentation

## 2013-10-05 DIAGNOSIS — Z3202 Encounter for pregnancy test, result negative: Secondary | ICD-10-CM | POA: Insufficient documentation

## 2013-10-05 LAB — CBC WITH DIFFERENTIAL/PLATELET
BASOS ABS: 0 10*3/uL (ref 0.0–0.1)
BASOS PCT: 0 % (ref 0–1)
Eosinophils Absolute: 0.1 10*3/uL (ref 0.0–0.7)
Eosinophils Relative: 2 % (ref 0–5)
HCT: 35.4 % — ABNORMAL LOW (ref 36.0–46.0)
HEMOGLOBIN: 11.9 g/dL — AB (ref 12.0–15.0)
Lymphocytes Relative: 26 % (ref 12–46)
Lymphs Abs: 1.6 10*3/uL (ref 0.7–4.0)
MCH: 26 pg (ref 26.0–34.0)
MCHC: 33.6 g/dL (ref 30.0–36.0)
MCV: 77.5 fL — ABNORMAL LOW (ref 78.0–100.0)
MONOS PCT: 5 % (ref 3–12)
Monocytes Absolute: 0.3 10*3/uL (ref 0.1–1.0)
NEUTROS ABS: 4.2 10*3/uL (ref 1.7–7.7)
Neutrophils Relative %: 67 % (ref 43–77)
Platelets: 178 10*3/uL (ref 150–400)
RBC: 4.57 MIL/uL (ref 3.87–5.11)
RDW: 14.2 % (ref 11.5–15.5)
WBC: 6.3 10*3/uL (ref 4.0–10.5)

## 2013-10-05 LAB — COMPREHENSIVE METABOLIC PANEL
ALBUMIN: 4 g/dL (ref 3.5–5.2)
ALK PHOS: 87 U/L (ref 39–117)
ALT: 49 U/L — ABNORMAL HIGH (ref 0–35)
AST: 25 U/L (ref 0–37)
BILIRUBIN TOTAL: 0.3 mg/dL (ref 0.3–1.2)
BUN: 7 mg/dL (ref 6–23)
CHLORIDE: 102 meq/L (ref 96–112)
CO2: 27 mEq/L (ref 19–32)
Calcium: 9.5 mg/dL (ref 8.4–10.5)
Creatinine, Ser: 0.47 mg/dL — ABNORMAL LOW (ref 0.50–1.10)
GFR calc Af Amer: >90 mL/min (ref >90)
GFR calc non Af Amer: >90 mL/min (ref >90)
Glucose, Bld: 101 mg/dL — ABNORMAL HIGH (ref 70–99)
Potassium: 3.8 mEq/L (ref 3.7–5.3)
SODIUM: 141 meq/L (ref 137–147)
Total Protein: 7.6 g/dL (ref 6.0–8.3)

## 2013-10-05 LAB — GLUCOSE, CAPILLARY: Glucose-Capillary: 102 mg/dL — ABNORMAL HIGH (ref 70–99)

## 2013-10-05 LAB — POCT I-STAT TROPONIN I: Troponin i, poc: 0.01 ng/mL (ref 0.00–0.08)

## 2013-10-05 LAB — LIPASE, BLOOD: Lipase: 41 U/L (ref 11–59)

## 2013-10-05 LAB — POCT PREGNANCY, URINE: Preg Test, Ur: NEGATIVE

## 2013-10-05 MED ORDER — SUCRALFATE 1 GM/10ML PO SUSP: 1.0000 g | Freq: Three times a day (TID) | ORAL | Status: AC

## 2013-10-05 MED ORDER — RANITIDINE HCL 150 MG PO TABS: 150.0000 mg | ORAL_TABLET | Freq: Two times a day (BID) | ORAL | Status: AC

## 2013-10-05 NOTE — ED Notes (Signed)
Patient is alert and orientedx4.  Patient was explained discharge instructions and they understood them with no questions.   

## 2013-10-05 NOTE — ED Provider Notes (Signed)
CSN: [id number]     Arrival date & time 10/05/13  0802 History   First MD Initiated Contact with Patient 10/05/13 [phone]     Chief Complaint  Patient presents with  . Abdominal Pain  . Dizziness   (Consider location/radiation/quality/duration/timing/severity/associated sxs/prior Treatment) HPI Comments: Patient presents to the for evaluation of abdominal discomfort. Patient reports that she has been experiencing discomfort in her upper abdomen when she this has been going on for weeks. She has had this problem before. She reports that it got better when she lost weight and was treated for GERD. She reports that she has recently gained some weight back and now is experiencing the symptoms began. She says that when she eats, sometimes it feels like food is getting caught on the way down. She has intermittent pain in the epigastric region with some discomfort that goes up into the throat region.  Patient is a 23 y.o. female presenting with abdominal pain and dizziness.  Abdominal Pain Dizziness   Past Medical History  Diagnosis Date  . GERD (gastroesophageal reflux disease)   . Diabetes mellitus without complication     non-insulin dependant  . Hypercholesteremia   . Depression    History reviewed. No pertinent past surgical history. No family history on file. History  Substance Use Topics  . Smoking status: Never Smoker   . Smokeless tobacco: Not on file  . Alcohol Use: No   OB History   Grav Para Term Preterm Abortions TAB SAB Ect Mult Living                 Review of Systems  HENT: Positive for trouble swallowing.   Gastrointestinal: Positive for abdominal pain.  Neurological: Positive for dizziness.  All other systems reviewed and are negative.    Allergies  Review of patient's allergies indicates no known allergies.  Home Medications   Current Outpatient Rx  Name  Route  Sig  Dispense  Refill  . cephALEXin (KEFLEX) 250 MG capsule   Oral   Take 2 capsules (500 mg  total) by mouth 4 (four) times daily.   80 capsule   0   . Cholecalciferol (VITAMIN D3) 10000 UNITS capsule   Oral   Take 10,000 Units by mouth daily.         Marland Kitchen. levonorgestrel (MIRENA) 20 MCG/24HR IUD   Intrauterine   1 each by Intrauterine route once.         . Pseudoeph-Doxylamine-DM-APAP (NYQUIL PO)   Oral   Take 30 mLs by mouth at bedtime as needed (cold and sleep).         . simvastatin (ZOCOR) 20 MG tablet   Oral   Take 20 mg by mouth every evening.          BP 124/58  Pulse 90  Temp(Src) 97.9 F (36.6 C) (Oral)  Resp 18  Ht 5\' 3"  (1.6 m)  Wt 189 lb (85.73 kg)  BMI 33.49 kg/m2  SpO2 99%  LMP 09/29/2013 Physical Exam  Constitutional: She is oriented to person, place, and time. She appears well-developed and well-nourished. No distress.  HENT:  Head: Normocephalic and atraumatic.  Right Ear: Hearing normal.  Left Ear: Hearing normal.  Nose: Nose normal.  Mouth/Throat: Oropharynx is clear and moist and mucous membranes are normal.  Eyes: Conjunctivae and EOM are normal. Pupils are equal, round, and reactive to light.  Neck: Normal range of motion. Neck supple.  Cardiovascular: Regular rhythm, S1 normal and S2 normal.  Exam reveals  no gallop and no friction rub.   No murmur heard. Pulmonary/Chest: Effort normal and breath sounds normal. No respiratory distress. She exhibits no tenderness.  Abdominal: Soft. Normal appearance and bowel sounds are normal. There is no hepatosplenomegaly. There is tenderness in the epigastric area. There is no rebound, no guarding, no tenderness at McBurney's point and negative Murphy's sign. No hernia.  Musculoskeletal: Normal range of motion.  Neurological: She is alert and oriented to person, place, and time. She has normal strength. No cranial nerve deficit or sensory deficit. Coordination normal. GCS eye subscore is 4. GCS verbal subscore is 5. GCS motor subscore is 6.  Skin: Skin is warm, dry and intact. No rash noted. No  cyanosis.  Psychiatric: She has a normal mood and affect. Her speech is normal and behavior is normal. Thought content normal.    ED Course  Procedures (including critical care time) Labs Review Labs Reviewed  CBC WITH DIFFERENTIAL  COMPREHENSIVE METABOLIC PANEL  LIPASE, BLOOD   Imaging Review No results found.  EKG Interpretation    Date/Time:  Thursday October 05 2013 08:06:02 EST Ventricular Rate:  83 PR Interval:  144 QRS Duration: 88 QT Interval:  366 QTC Calculation: 430 R Axis:   69 Text Interpretation:  Normal sinus rhythm with sinus arrhythmia Normal ECG Confirmed by POLLINA  MD, CHRISTOPHER ([id number]) on 10/05/2013 8:16:01 AM            MDM  Diagnosis: Abdominal pain  Patient presents to the ER for evaluation of upper abdominal pain. Indicates that she has had discomfort into the upper abdomen, upper chest and as high as the throat, associated with eating. She has had some difficulty swallowing as well. Patient has a history of GERD, treated with Protonix. Patient does not have any cardiac risk factors. Cardiac workup was normal. Patient had mild epigastric tenderness without tenderness over the right upper quadrant. Gallbladder ultrasound was negative. Patient will require GI followup, continue to treat for GERD. Continue Protonix, add Zantac when necessary and Carafate.   Gilda Crease, MD 10/05/13 1043

## 2013-10-05 NOTE — Discharge Instructions (Signed)
Abdominal Pain Abdominal pain can be caused by many things. Your caregiver decides the seriousness of your pain by an examination and possibly blood tests and X-rays. Many cases can be observed and treated at home. Most abdominal pain is not caused by a disease and will probably improve without treatment. However, in many cases, more time must pass before a clear cause of the pain can be found. Before that point, it may not be known if you need more testing, or if hospitalization or surgery is needed. HOME CARE INSTRUCTIONS   Do not take laxatives unless directed by your caregiver.  Take pain medicine only as directed by your caregiver.  Only take over-the-counter or prescription medicines for pain, discomfort, or fever as directed by your caregiver.  Try a clear liquid diet (broth, tea, or water) for as long as directed by your caregiver. Slowly move to a bland diet as tolerated. SEEK IMMEDIATE MEDICAL CARE IF:   The pain does not go away.  You have a fever.  You keep throwing up (vomiting).  The pain is felt only in portions of the abdomen. Pain in the right side could possibly be appendicitis. In an adult, pain in the left lower portion of the abdomen could be colitis or diverticulitis.  You pass bloody or black tarry stools. MAKE SURE YOU:   Understand these instructions.  Will watch your condition.  Will get help right away if you are not doing well or get worse. Document Released: 06/24/2005 Document Revised: 12/07/2011 Document Reviewed: 05/02/2008 [facility] Patient Information 2014 [place], [place].  Gastroesophageal Reflux Disease, Adult Gastroesophageal reflux disease (GERD) happens when acid from your stomach flows up into the esophagus. When acid comes in contact with the esophagus, the acid causes soreness (inflammation) in the esophagus. Over time, GERD may create small holes (ulcers) in the lining of the esophagus. CAUSES   Increased body weight. This puts pressure on  the stomach, making acid rise from the stomach into the esophagus.  Smoking. This increases acid production in the stomach.  Drinking alcohol. This causes decreased pressure in the lower esophageal sphincter (valve or ring of muscle between the esophagus and stomach), allowing acid from the stomach into the esophagus.  Late evening meals and a full stomach. This increases pressure and acid production in the stomach.  A malformed lower esophageal sphincter. Sometimes, no cause is found. SYMPTOMS   Burning pain in the lower part of the mid-chest behind the breastbone and in the mid-stomach area. This may occur twice a week or more often.  Trouble swallowing.  Sore throat.  Dry cough.  Asthma-like symptoms including chest tightness, shortness of breath, or wheezing. DIAGNOSIS  Your caregiver may be able to diagnose GERD based on your symptoms. In some cases, X-rays and other tests may be done to check for complications or to check the condition of your stomach and esophagus. TREATMENT  Your caregiver may recommend over-the-counter or prescription medicines to help decrease acid production. Ask your caregiver before starting or adding any new medicines.  HOME CARE INSTRUCTIONS   Change the factors that you can control. Ask your caregiver for guidance concerning weight loss, quitting smoking, and alcohol consumption.  Avoid foods and drinks that make your symptoms worse, such as:  Caffeine or alcoholic drinks.  Chocolate.  Peppermint or mint flavorings.  Garlic and onions.  Spicy foods.  Citrus fruits, such as oranges, lemons, or limes.  Tomato-based foods such as sauce, chili, salsa, and pizza.  Fried and fatty foods.  Avoid lying down for the 3 hours prior to your bedtime or prior to taking a nap.  Eat small, frequent meals instead of large meals.  Wear loose-fitting clothing. Do not wear anything tight around your waist that causes pressure on your stomach.  Raise  the head of your bed 6 to 8 inches with wood blocks to help you sleep. Extra pillows will not help.  Only take over-the-counter or prescription medicines for pain, discomfort, or fever as directed by your caregiver.  Do not take aspirin, ibuprofen, or other nonsteroidal anti-inflammatory drugs (NSAIDs). SEEK IMMEDIATE MEDICAL CARE IF:   You have pain in your arms, neck, jaw, teeth, or back.  Your pain increases or changes in intensity or duration.  You develop nausea, vomiting, or sweating (diaphoresis).  You develop shortness of breath, or you faint.  Your vomit is green, yellow, black, or looks like coffee grounds or blood.  Your stool is red, bloody, or black. These symptoms could be signs of other problems, such as heart disease, gastric bleeding, or esophageal bleeding. MAKE SURE YOU:   Understand these instructions.  Will watch your condition.  Will get help right away if you are not doing well or get worse. Document Released: 06/24/2005 Document Revised: 12/07/2011 Document Reviewed: 04/03/2011 [facility] Patient Information 2014 [place], [place].

## 2013-10-05 NOTE — ED Notes (Signed)
Pt with hx of gerd and diabetes to ED c/o epigastric pain, feeling as if her throat is closing and dizziness.  Her blood sugar was 98 before coming here.

## 2014-12-23 IMAGING — US US ABDOMEN COMPLETE
1 series · 14 of 25 positions shown · non-contrast
Comparison: None.

CLINICAL DATA: Abdominal pain with eating.

EXAM:
ULTRASOUND ABDOMEN COMPLETE

[Series 1: us abdomen complete · 0.27mm/px · 14 of 71 slices shown]
[im 1/71]
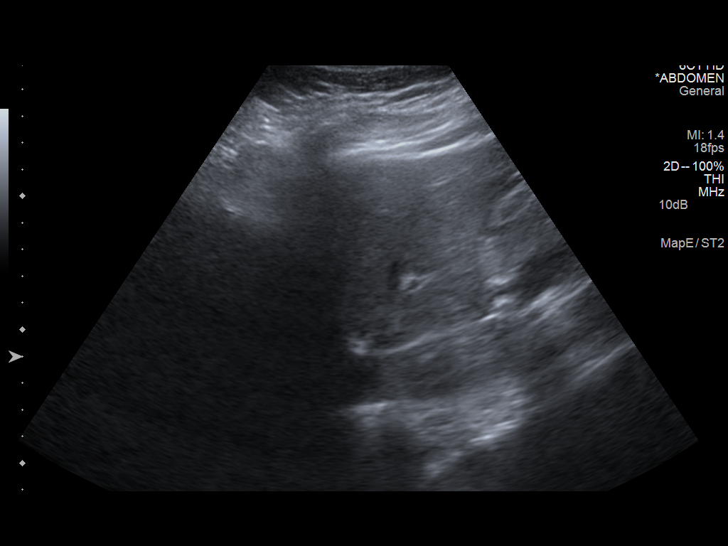
[im 6/71]
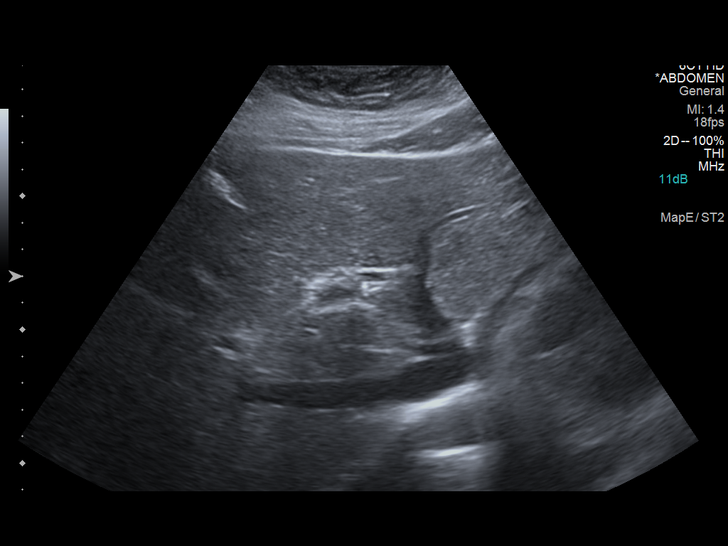
[im 12/71]
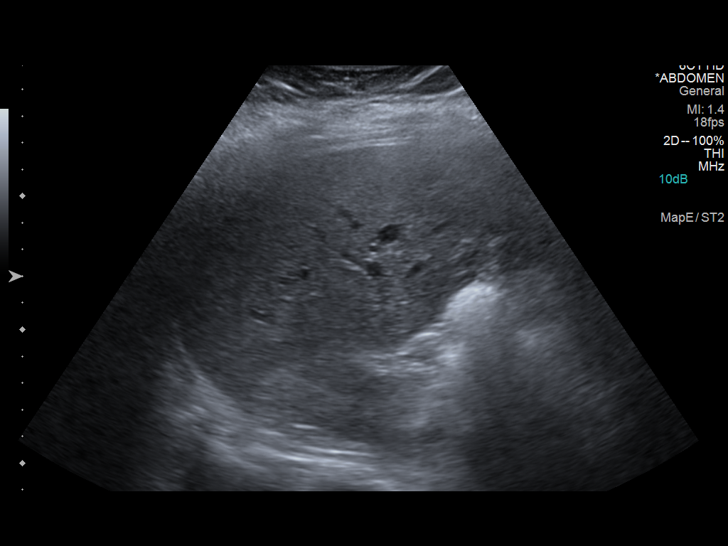
[im 18/71]
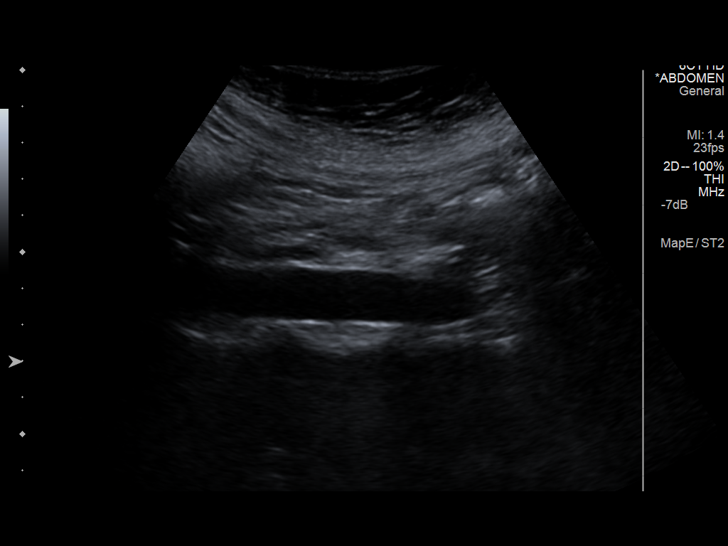
[im 24/71]
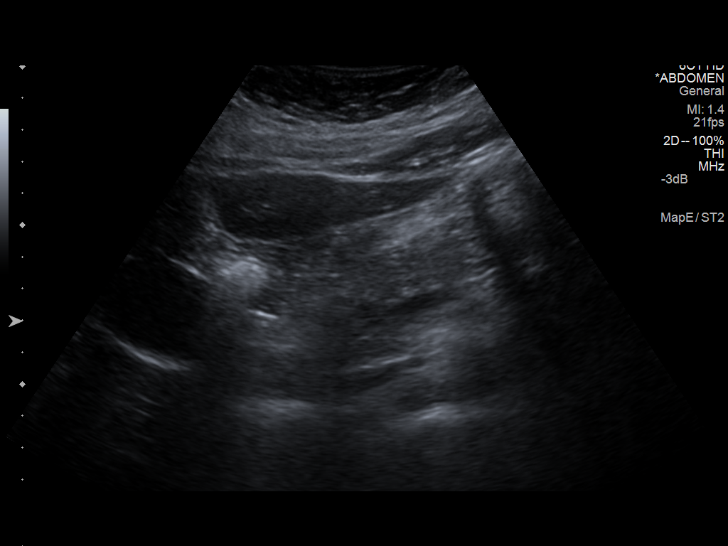
[im 27/71]
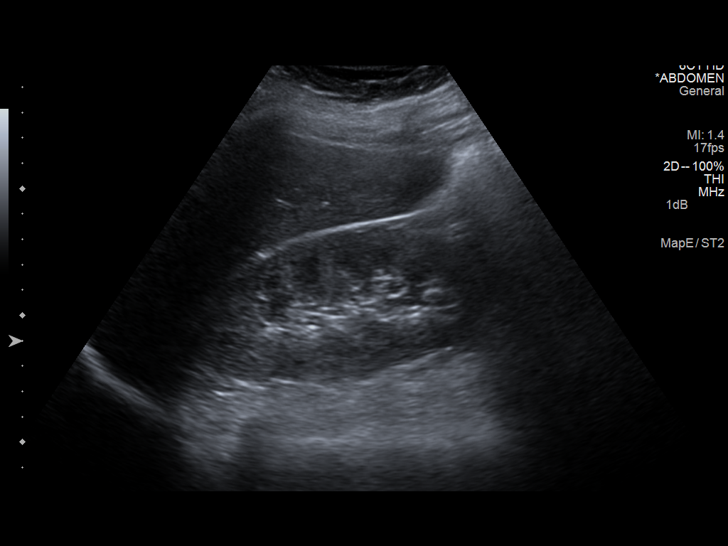
[im 33/71]
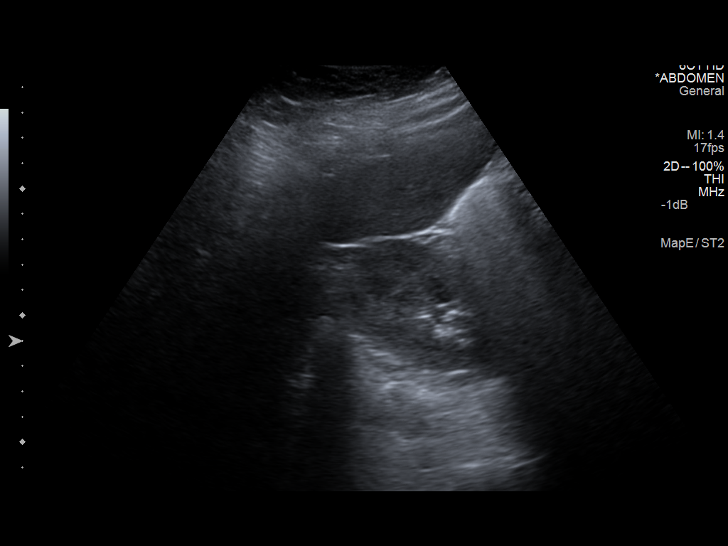
[im 38/71]
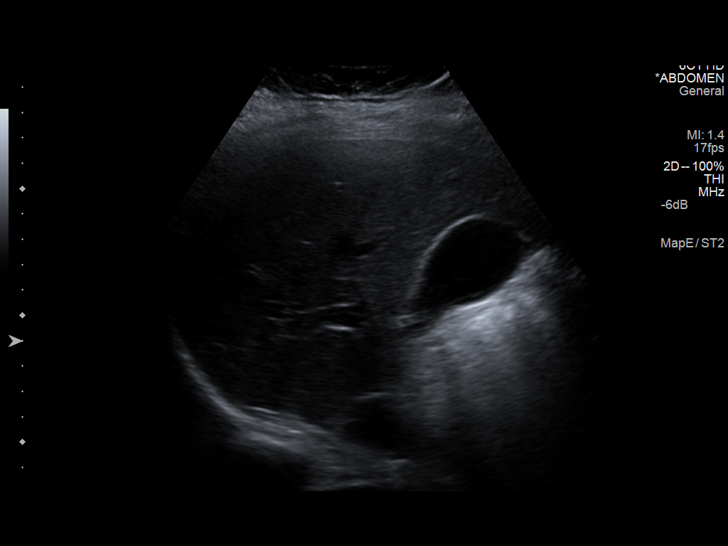
[im 44/71]
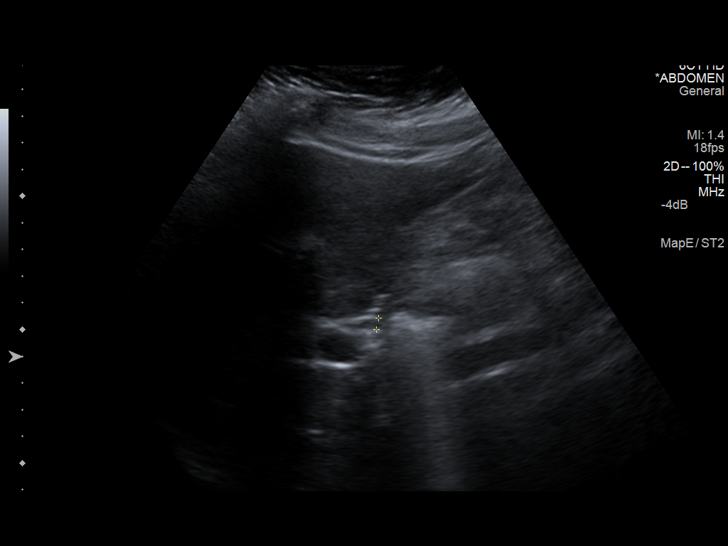
[im 47/71]
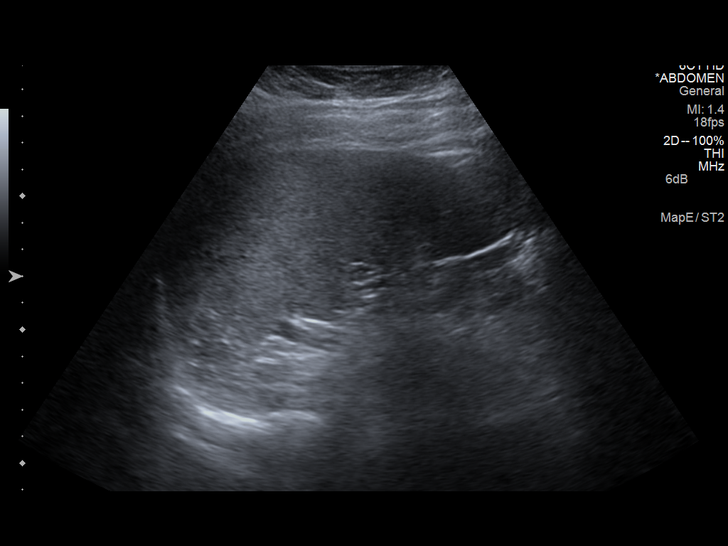
[im 53/71]
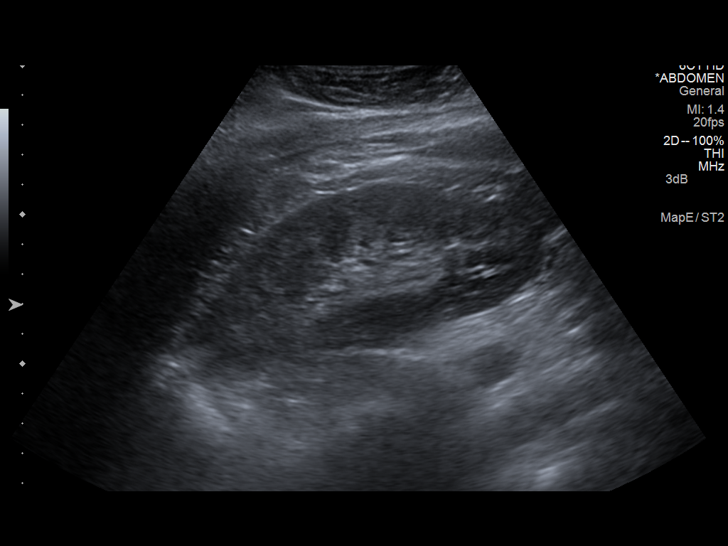
[im 59/71]
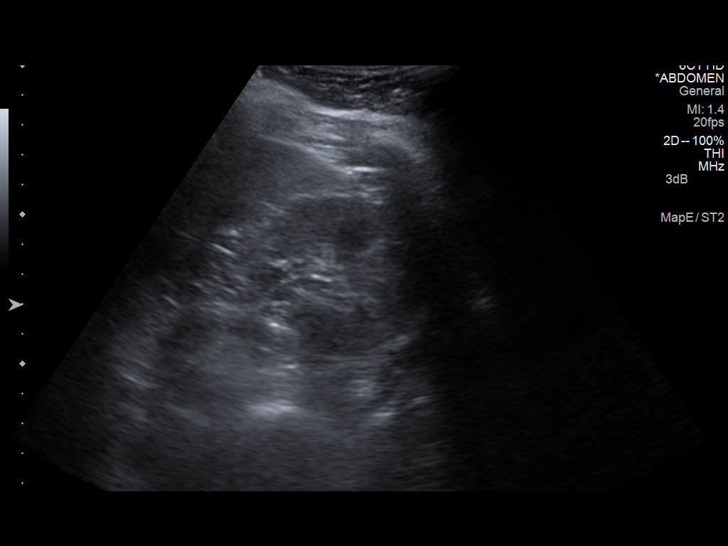
[im 65/71]
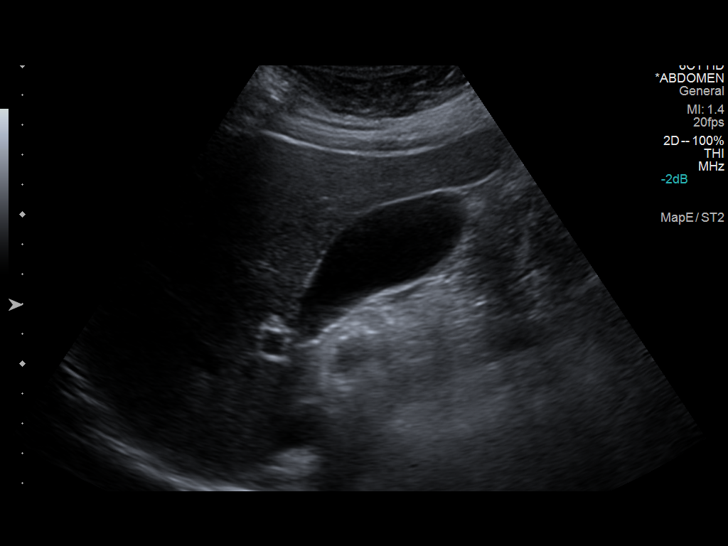
[im 71/71]
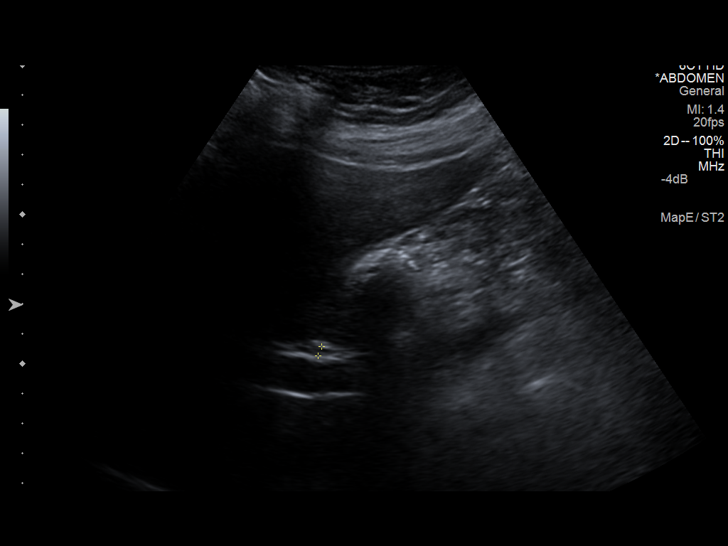

[14 of 25 positions shown; findings below may reference images not displayed]

FINDINGS: Gallbladder:

No gallstones or wall thickening visualized. No sonographic Murphy
sign noted.

Common bile duct:

Diameter: 4.3 mm

Liver:

No focal lesion identified. Within normal limits in parenchymal
echogenicity.

IVC:

No abnormality visualized.

Pancreas:

Visualized portion unremarkable.

Spleen:

Splenomegaly without focal lesion. Splenic volume 526 mL. Splenic
length 11.4 cm.

Right Kidney:

Length: 13.0 cm. Echogenicity within normal limits. No mass or
hydronephrosis visualized.

Left Kidney:

Length: 12.8 cm. Echogenicity within normal limits. No mass or
hydronephrosis visualized.

Abdominal aorta:

No aneurysm visualized.

Other findings:

None.
IMPRESSION: Negative for gallstones.

Splenomegaly.

## 2015-07-22 IMAGING — US THYROID ULTRASOUND
1 series · 14 of 25 positions shown · non-contrast
Comparison: None.

CLINICAL DATA: Left nodule

EXAM:
THYROID ULTRASOUND
TECHNIQUE: Ultrasound examination of the thyroid gland and adjacent soft
tissues was performed.

[Series 1: thyroid ultrasound · 0.06mm/px · 14 of 52 slices shown]
[im 1/52]
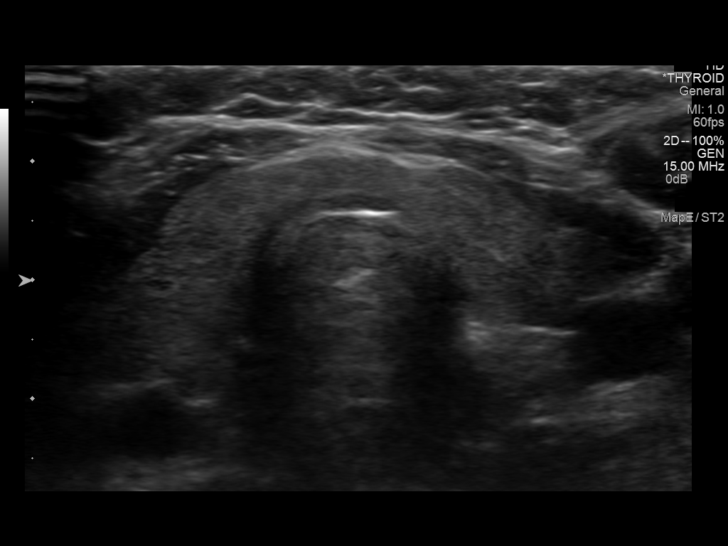
[im 5/52]
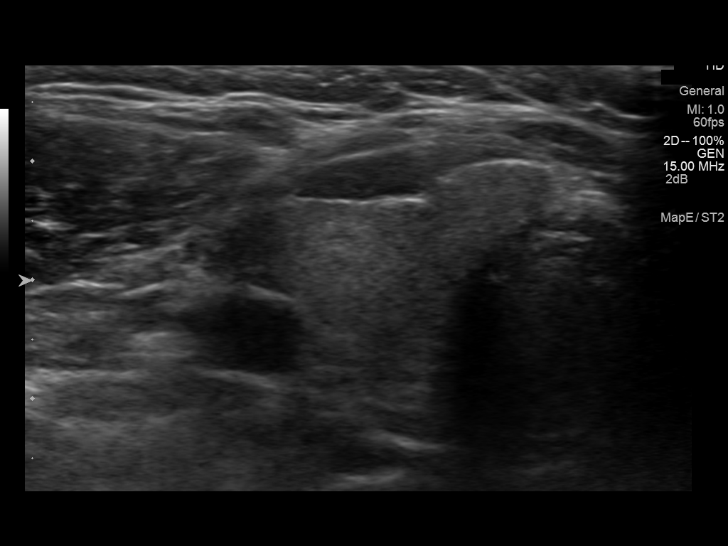
[im 9/52]
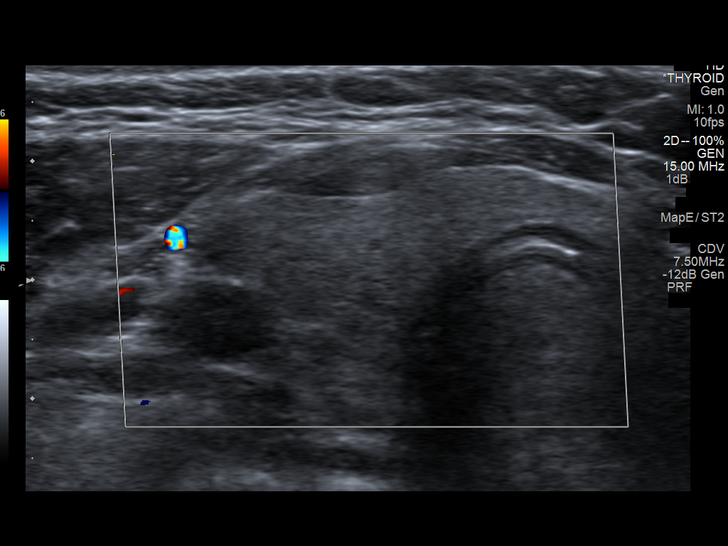
[im 13/52]
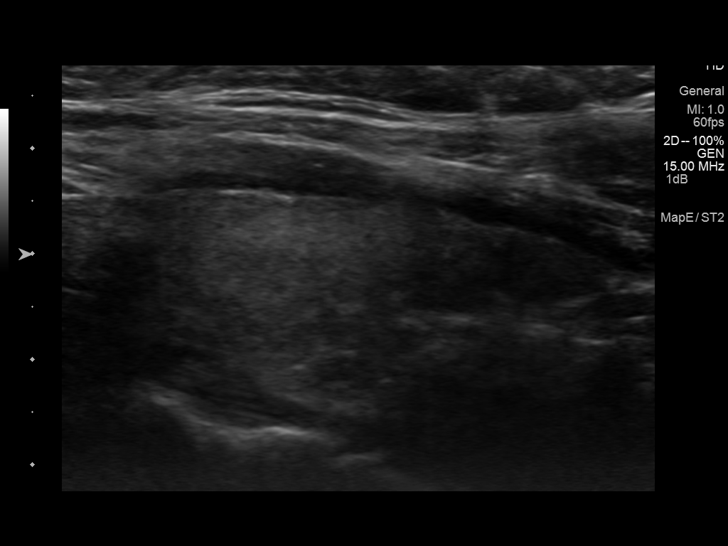
[im 18/52]
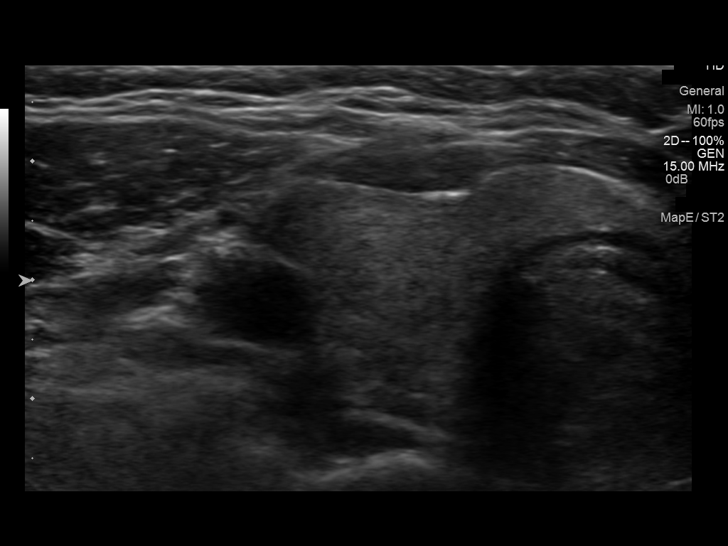
[im 20/52]
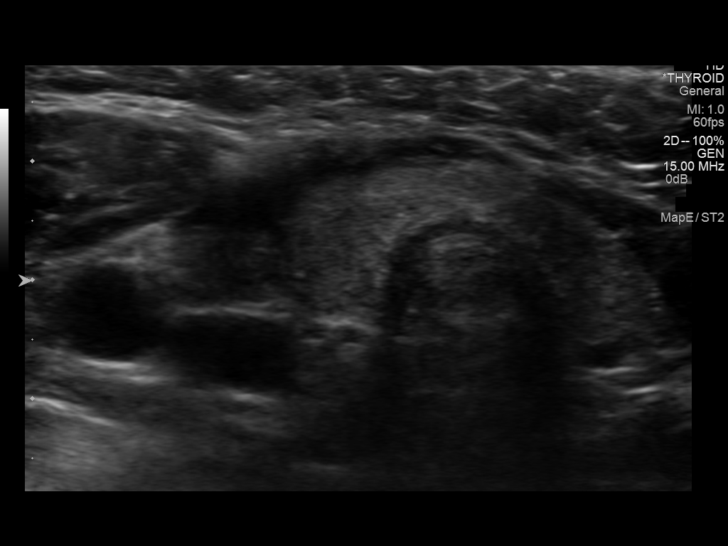
[im 24/52]
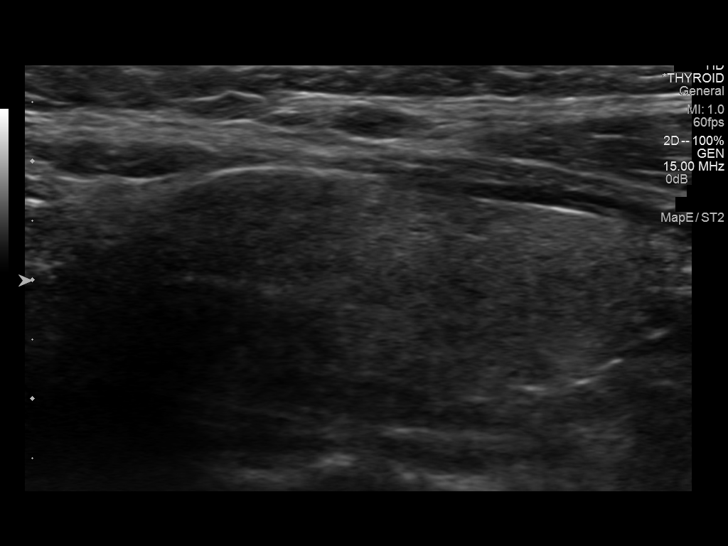
[im 28/52]
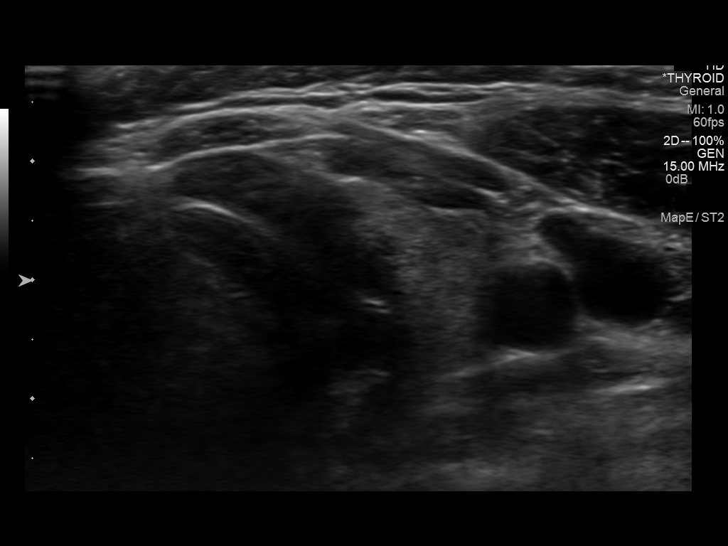
[im 32/52]
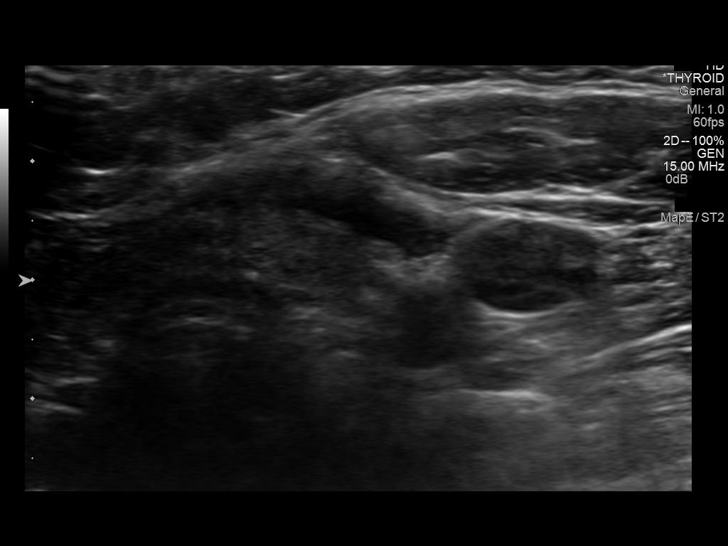
[im 35/52]
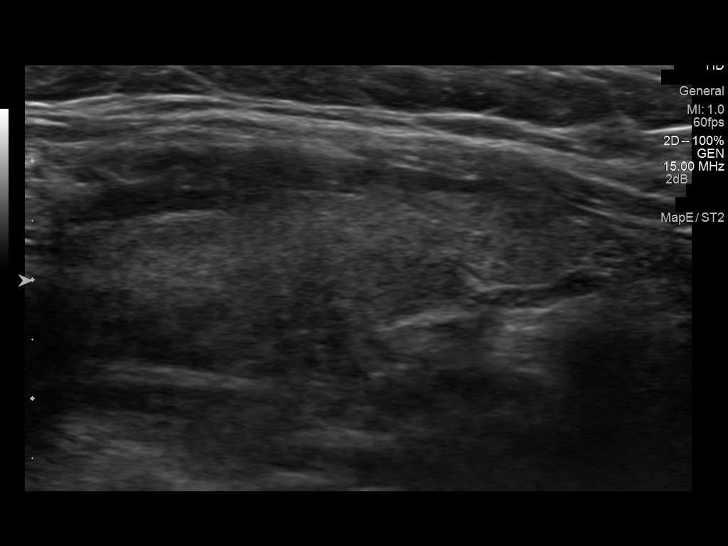
[im 39/52]
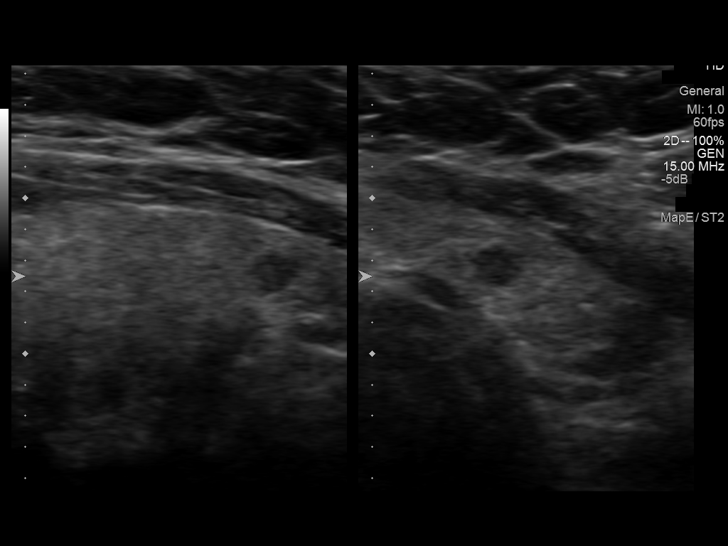
[im 43/52]
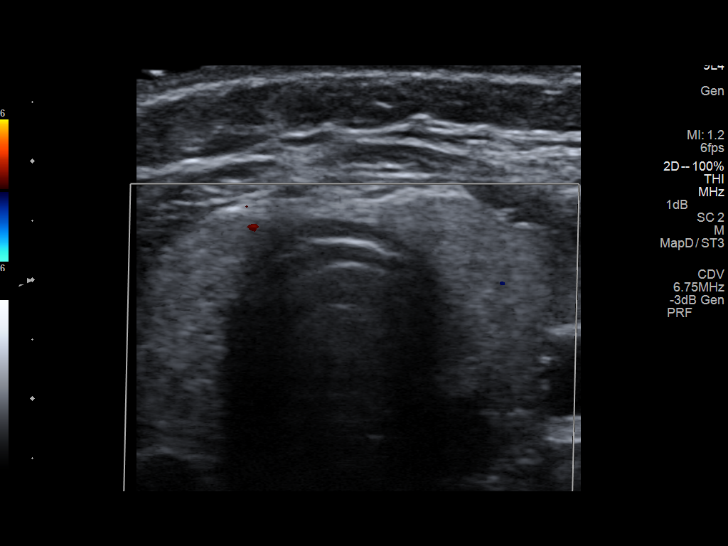
[im 47/52]
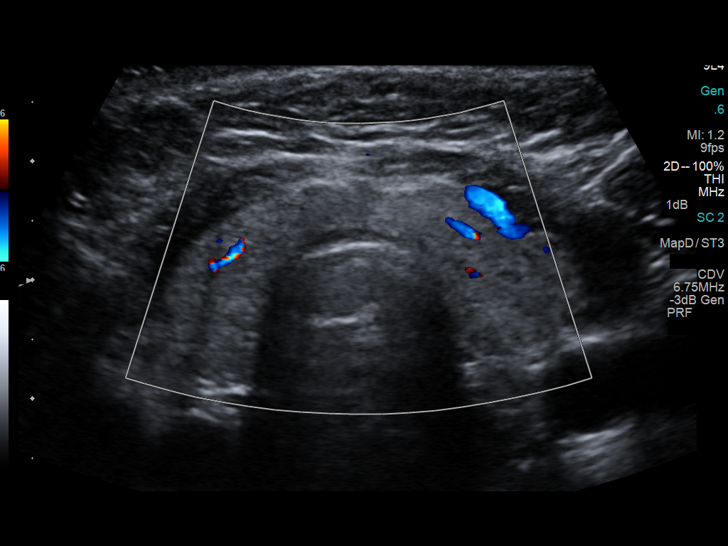
[im 52/52]
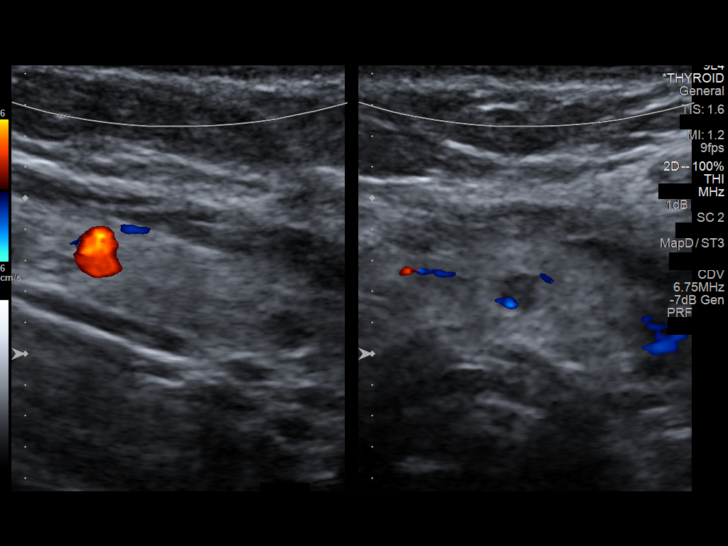

[14 of 25 positions shown; findings below may reference images not displayed]

FINDINGS: Right thyroid lobe

Measurements: 4.8 x 2.0 x 2.0 cm.  Homogeneous gland without nodule.

Left thyroid lobe

Measurements: 4.6 x 1.7 x 1.5 cm. 3 x 3 x 3 mm hypoechoic nodule in
the lower pole. Otherwise, homogeneous glandular tissue.

Isthmus

Thickness: 4 mm.  No nodules visualized.

Lymphadenopathy

None visualized.
IMPRESSION: 3 mm nodule in the left lobe. Findings do not meet current SRU
consensus criteria for biopsy. Follow-up by clinical exam is
recommended. If patient has known risk factors for thyroid
carcinoma, consider follow-up ultrasound in 12 months. If patient is
clinically hyperthyroid, consider nuclear medicine thyroid uptake
and scan.Reference: Management of Thyroid Nodules Detected at US:
Society of Radiologists in Ultrasound Consensus Conference

## 2015-11-08 ENCOUNTER — Other Ambulatory Visit: Payer: Self-pay | Admitting: Family Medicine

## 2015-11-08 DIAGNOSIS — E041 Nontoxic single thyroid nodule: Secondary | ICD-10-CM

## 2015-11-14 ENCOUNTER — Ambulatory Visit: Payer: Medicaid Other

## 2015-11-22 ENCOUNTER — Ambulatory Visit
Admission: RE | Admit: 2015-11-22 | Discharge: 2015-11-22 | Disposition: A | Discharge status: Home / Self Care | Payer: Managed Care, Other (non HMO) | Source: Ambulatory Visit | Attending: Family Medicine | Admitting: Family Medicine

## 2015-11-22 DIAGNOSIS — E041 Nontoxic single thyroid nodule: Secondary | ICD-10-CM

## 2017-07-01 IMAGING — US US THYROID
1 series · 14 of 25 positions shown · non-contrast
Comparison: 05/04/2014

CLINICAL DATA: Known thyroid nodule

EXAM:
THYROID ULTRASOUND
TECHNIQUE: Ultrasound examination of the thyroid gland and adjacent soft
tissues was performed.

[Series 1: us thyroid · 0.07mm/px · 14 of 37 slices shown]
[im 1/37]
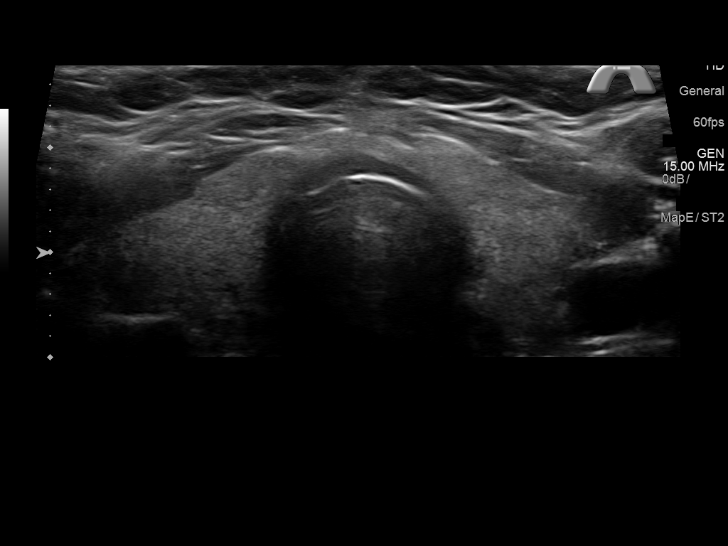
[im 4/37]
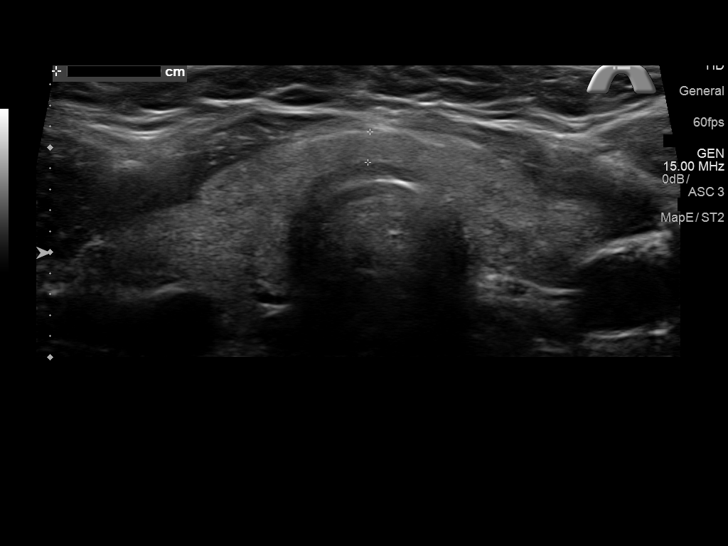
[im 7/37]
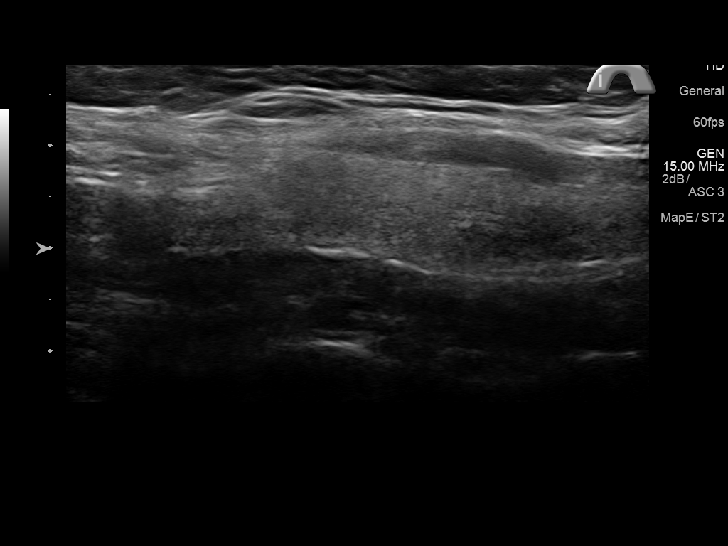
[im 10/37]
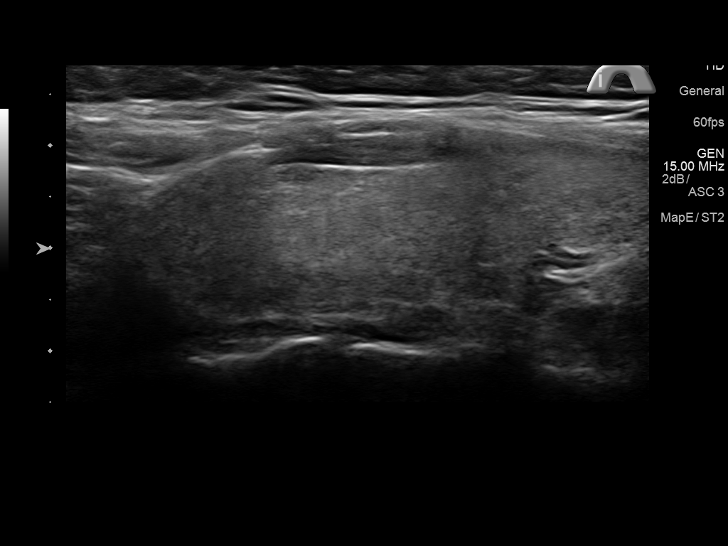
[im 13/37]
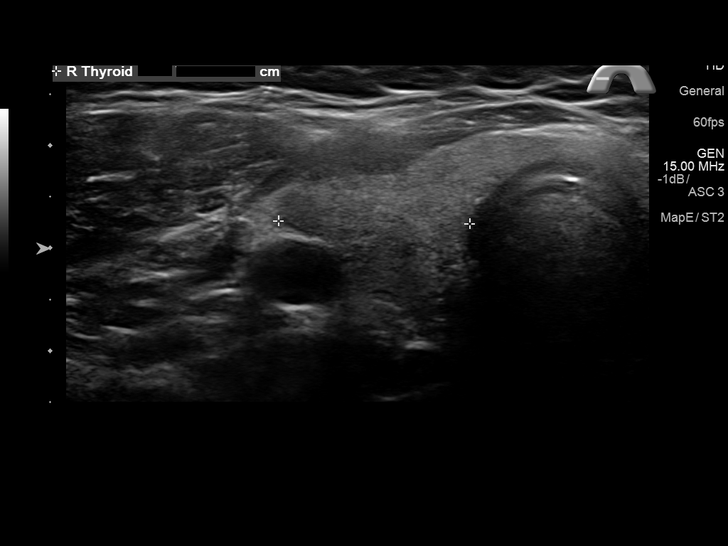
[im 14/37]
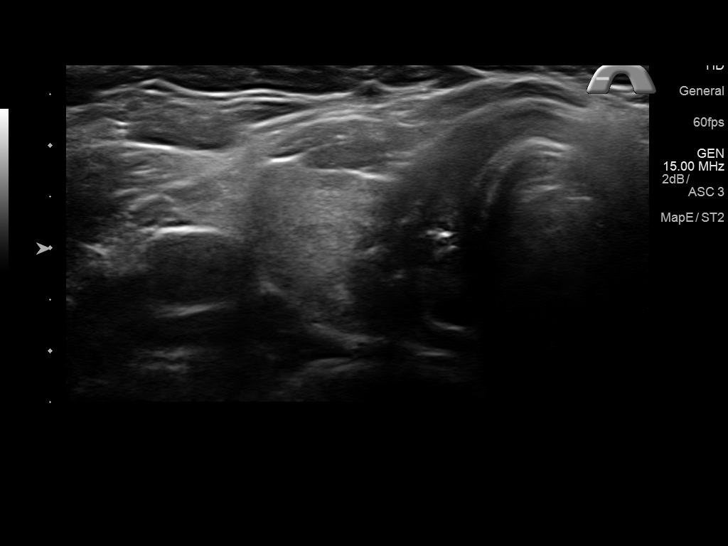
[im 17/37]
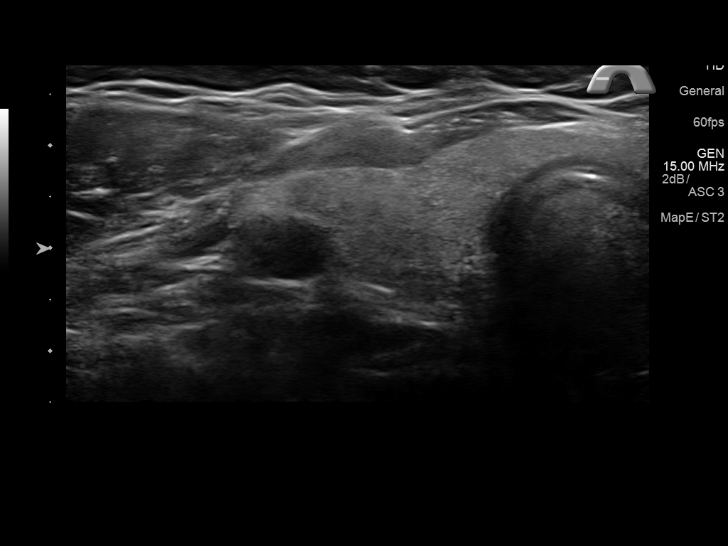
[im 20/37]
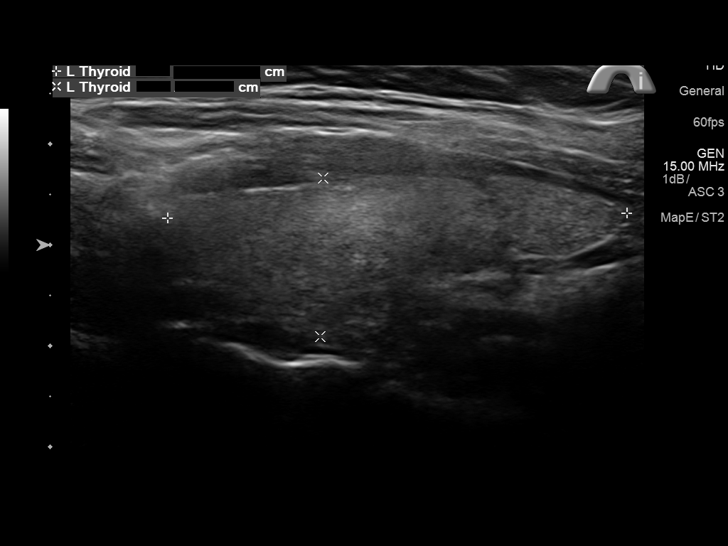
[im 23/37]
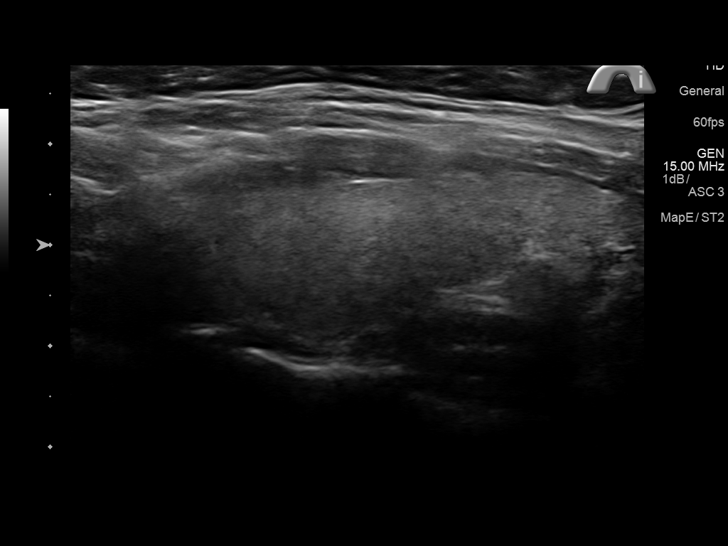
[im 25/37]
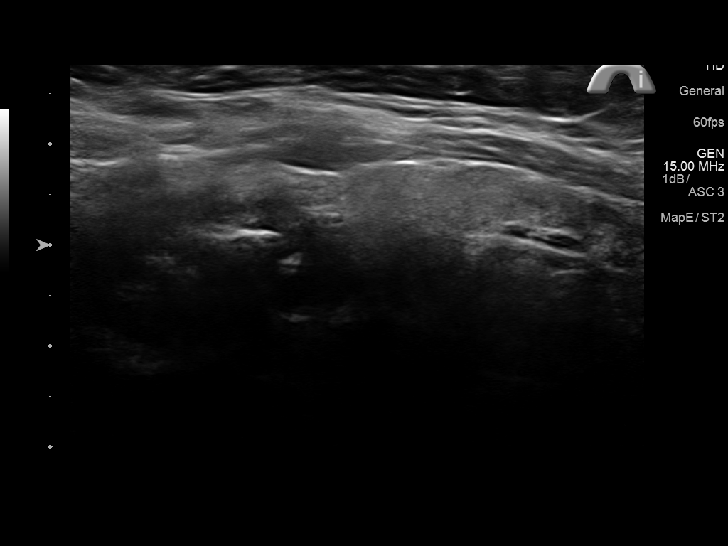
[im 28/37]
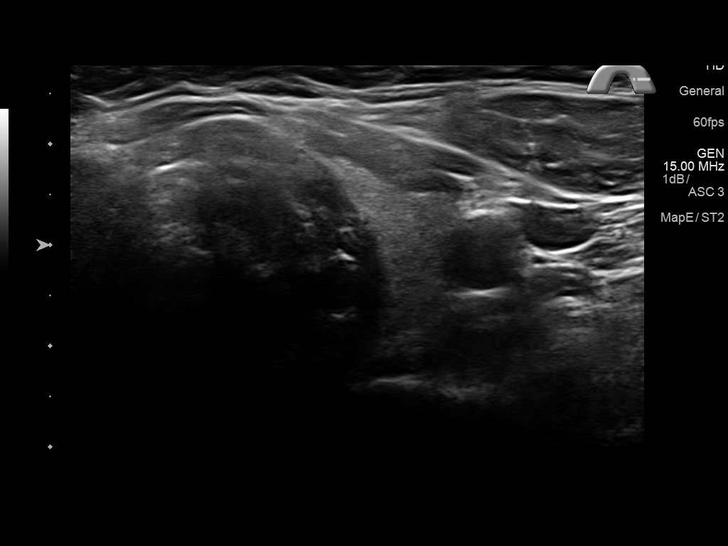
[im 31/37]
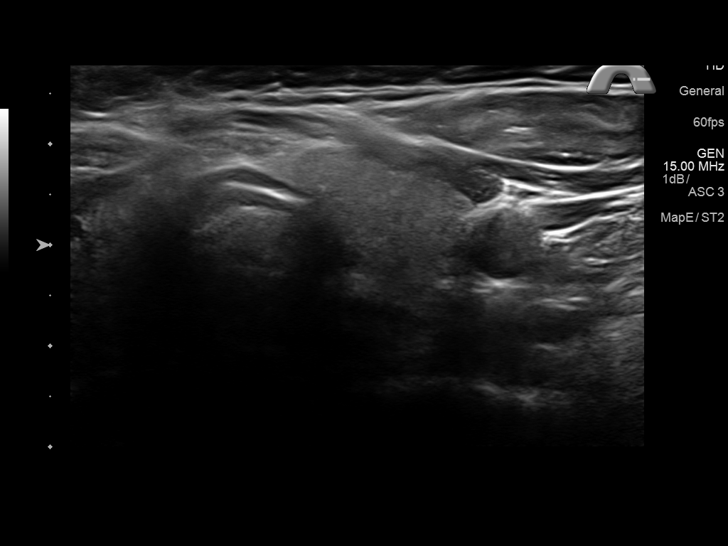
[im 34/37]
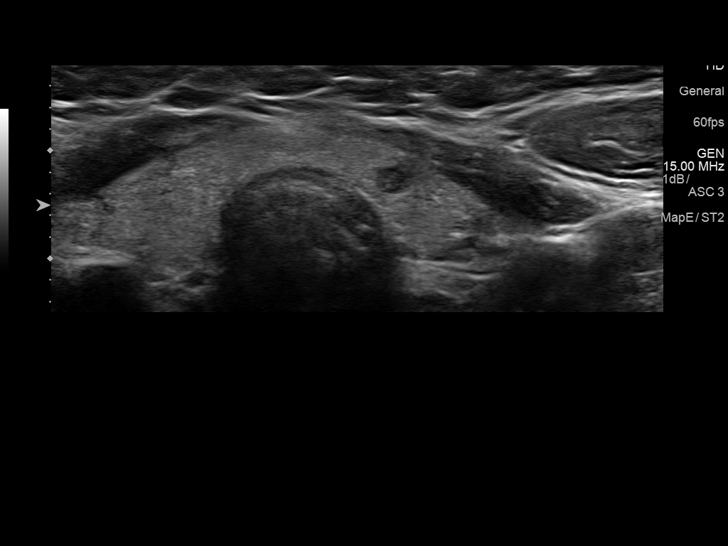
[im 37/37]
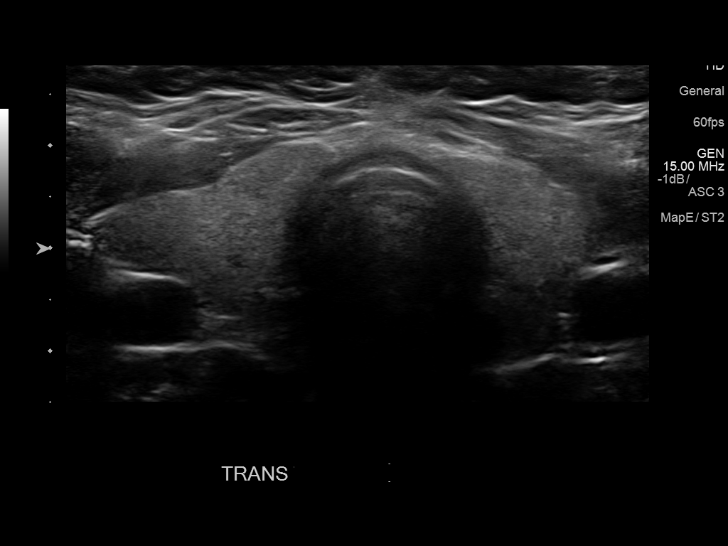

[14 of 25 positions shown; findings below may reference images not displayed]

FINDINGS: Right thyroid lobe

Measurements: 5.2 x 1.6 x 1.9 cm..  No nodules visualized.

Left thyroid lobe

Measurements: 4.5 x 1.6 x 1.4 cm.. Stable 3-4 mm hypoechoic nodule
is noted in the lower pole of the left lobe of the thyroid.

Isthmus

Thickness: 3 mm.  No nodules visualized.

Lymphadenopathy

None visualized.
IMPRESSION: Stable left thyroid nodule. Given its stability over almost
Years it is consistent with a benign etiology.

## 2017-11-23 DIAGNOSIS — E119 Type 2 diabetes mellitus without complications: Secondary | ICD-10-CM | POA: Diagnosis not present

## 2017-12-02 DIAGNOSIS — E119 Type 2 diabetes mellitus without complications: Secondary | ICD-10-CM | POA: Diagnosis not present

## 2017-12-02 DIAGNOSIS — Z0000 Encounter for general adult medical examination without abnormal findings: Secondary | ICD-10-CM | POA: Diagnosis not present

## 2018-05-21 DIAGNOSIS — R399 Unspecified symptoms and signs involving the genitourinary system: Secondary | ICD-10-CM | POA: Diagnosis not present

## 2018-05-21 DIAGNOSIS — N390 Urinary tract infection, site not specified: Secondary | ICD-10-CM | POA: Diagnosis not present

## 2018-05-24 DIAGNOSIS — E119 Type 2 diabetes mellitus without complications: Secondary | ICD-10-CM | POA: Diagnosis not present

## 2018-05-31 DIAGNOSIS — E782 Mixed hyperlipidemia: Secondary | ICD-10-CM | POA: Diagnosis not present

## 2018-05-31 DIAGNOSIS — E119 Type 2 diabetes mellitus without complications: Secondary | ICD-10-CM | POA: Diagnosis not present

## 2018-05-31 DIAGNOSIS — R748 Abnormal levels of other serum enzymes: Secondary | ICD-10-CM | POA: Diagnosis not present

## 2018-07-14 DIAGNOSIS — J029 Acute pharyngitis, unspecified: Secondary | ICD-10-CM | POA: Diagnosis not present

## 2018-07-14 DIAGNOSIS — J020 Streptococcal pharyngitis: Secondary | ICD-10-CM | POA: Diagnosis not present

## 2018-12-02 DIAGNOSIS — Z79899 Other long term (current) drug therapy: Secondary | ICD-10-CM | POA: Diagnosis not present

## 2018-12-02 DIAGNOSIS — E119 Type 2 diabetes mellitus without complications: Secondary | ICD-10-CM | POA: Diagnosis not present

## 2018-12-09 DIAGNOSIS — Z0000 Encounter for general adult medical examination without abnormal findings: Secondary | ICD-10-CM | POA: Diagnosis not present

## 2018-12-09 DIAGNOSIS — E119 Type 2 diabetes mellitus without complications: Secondary | ICD-10-CM | POA: Diagnosis not present

## 2018-12-09 DIAGNOSIS — E7800 Pure hypercholesterolemia, unspecified: Secondary | ICD-10-CM | POA: Diagnosis not present

## 2019-01-17 DIAGNOSIS — J302 Other seasonal allergic rhinitis: Secondary | ICD-10-CM | POA: Diagnosis not present

## 2024-04-08 ENCOUNTER — Other Ambulatory Visit: Payer: Self-pay

## 2024-04-08 ENCOUNTER — Encounter (HOSPITAL_COMMUNITY): Payer: Self-pay | Admitting: *Deleted

## 2024-04-08 ENCOUNTER — Emergency Department (HOSPITAL_COMMUNITY)
Admission: EM | Admit: 2024-04-08 | Discharge: 2024-04-08 | Disposition: A | Discharge status: Home / Self Care | Attending: Emergency Medicine | Admitting: Emergency Medicine

## 2024-04-08 DIAGNOSIS — Z23 Encounter for immunization: Secondary | ICD-10-CM | POA: Diagnosis not present

## 2024-04-08 DIAGNOSIS — W25XXXA Contact with sharp glass, initial encounter: Secondary | ICD-10-CM | POA: Diagnosis not present

## 2024-04-08 DIAGNOSIS — S81011A Laceration without foreign body, right knee, initial encounter: Secondary | ICD-10-CM | POA: Diagnosis present

## 2024-04-08 DIAGNOSIS — S81811A Laceration without foreign body, right lower leg, initial encounter: Secondary | ICD-10-CM

## 2024-04-08 MED ORDER — LIDOCAINE-EPINEPHRINE (PF) 2 %-1:200000 IJ SOLN
10.0000 mL | Freq: Once | INTRAMUSCULAR | Status: AC
  Administered 2024-04-08: 10 mL
  Filled 2024-04-08: qty 20

## 2024-04-08 MED ORDER — TETANUS-DIPHTH-ACELL PERTUSSIS 5-2.5-18.5 LF-MCG/0.5 IM SUSY
0.5000 mL | PREFILLED_SYRINGE | Freq: Once | INTRAMUSCULAR | Status: AC
  Administered 2024-04-08: 0.5 mL via INTRAMUSCULAR
  Filled 2024-04-08: qty 0.5

## 2024-04-08 NOTE — ED Triage Notes (Signed)
 Laceration to the rt knee she was carrying glass  pumpkins and they fell and lacerated her rt knee

## 2024-04-08 NOTE — ED Notes (Signed)
 Provider at bedside to to a lac repair.

## 2024-04-08 NOTE — ED Notes (Signed)
 Suture cart and lido with epi at bedside.

## 2024-04-08 NOTE — Discharge Instructions (Addendum)
 Keep wound clean and dry. Do now shower for the next 48 hours to prevent dirt from getting into your new wound. Do not soak wound. You can leave wound uncovered while at home, cover with a bandaid if you leave the house.

## 2024-04-08 NOTE — ED Provider Notes (Signed)
 [facility] Provider Note   CSN: [id number] Arrival date & time: 04/08/24  1758     Patient presents with: Extremity Laceration   Rachael Bell is a 33 y.o. female.   33 year old female presents with laceration to the right knee which occurred just prior to arrival. Patient cut her knee on a ceramic pumpkin, bleeding is controlled. Last td less unknown.        Prior to Admission medications   Medication Sig Start Date End Date Taking? Authorizing Provider  cephALEXin  (KEFLEX ) 250 MG capsule Take 2 capsules (500 mg total) by mouth 4 (four) times daily. 08/26/13   Sciacca, Marissa, PA-C  Cholecalciferol (VITAMIN D3) 10000 UNITS capsule Take 10,000 Units by mouth daily.    [provider]  levonorgestrel (MIRENA) 20 MCG/24HR IUD 1 each by Intrauterine route once.    [provider]  pantoprazole (PROTONIX) 40 MG tablet Take 40 mg by mouth daily.    [provider]  ranitidine  (ZANTAC ) 150 MG tablet Take 1 tablet (150 mg total) by mouth 2 (two) times daily. 10/05/13   Haze Lonni PARAS, MD  simvastatin (ZOCOR) 20 MG tablet Take 20 mg by mouth every evening.    [provider]  sucralfate  (CARAFATE ) 1 GM/10ML suspension Take 10 mLs (1 g total) by mouth 4 (four) times daily -  with meals and at bedtime. 10/05/13   Haze Lonni PARAS, MD    Allergies: Patient has no known allergies.    Review of Systems Negative except as per HPI Updated Vital Signs BP (!) 139/91 (BP Location: Right Arm)   Pulse 96   Temp 98.9 F (37.2 C)   Resp 17   Ht 5' 3 (1.6 m)   Wt 85.7 kg   LMP 04/08/2024   SpO2 97%   BMI 33.47 kg/m   Physical Exam Vitals and nursing note reviewed.  Constitutional:      General: She is not in acute distress.    Appearance: She is well-developed. She is not diaphoretic.  HENT:     Head: Normocephalic and atraumatic.  Pulmonary:     Effort: Pulmonary effort is normal.   Musculoskeletal:        General: Signs of injury present. No swelling, tenderness or deformity. Normal range of motion.       Legs:  Skin:    General: Skin is warm and dry.     Findings: No erythema or rash.  Neurological:     Mental Status: She is alert and oriented to person, place, and time.     Sensory: No sensory deficit.     Motor: No weakness.  Psychiatric:        Behavior: Behavior normal.     (all labs ordered are listed, but only abnormal results are displayed) Labs Reviewed - No data to display  EKG: None  Radiology: No results found.   .Laceration Repair  Date/Time: 04/08/2024 10:36 PM  Performed by: Beverley Leita LABOR, PA-C Authorized by: Beverley Leita LABOR, PA-C   Consent:    Consent obtained:  Verbal   Consent given by:  Patient   Risks, benefits, and alternatives were discussed: yes     Risks discussed:  Infection, need for additional repair, pain, poor cosmetic result, poor wound healing and retained foreign body   Alternatives discussed:  No treatment Universal protocol:    Patient identity confirmed:  Verbally with patient Anesthesia:    Anesthesia method:  Local infiltration  Local anesthetic:  Lidocaine  2% WITH epi Laceration details:    Location:  Leg   Leg location:  R knee   Length (cm):  2.5   Depth (mm):  2 Pre-procedure details:    Preparation:  Patient was prepped and draped in usual sterile fashion Exploration:    Limited defect created (wound extended): no     Hemostasis achieved with:  Epinephrine    Wound exploration: wound explored through full range of motion and entire depth of wound visualized     Wound extent: no foreign body, no signs of injury, no nerve damage and no tendon damage     Contaminated: no   Treatment:    Area cleansed with:  Saline   Amount of cleaning:  Standard   Irrigation solution:  Sterile saline   Debridement:  None   Undermining:  None Skin repair:    Repair method:  Sutures   Suture size:  4-0    Suture material:  Prolene   Suture technique:  Simple interrupted   Number of sutures:  3 Approximation:    Approximation:  Close Repair type:    Repair type:  Simple Post-procedure details:    Dressing:  Non-adherent dressing   Procedure completion:  Tolerated well, no immediate complications    Medications Ordered in the ED  lidocaine -EPINEPHrine  (XYLOCAINE  W/EPI) 2 %-1:200000 (PF) injection 10 mL (has no administration in time range)  Tdap (BOOSTRIX ) injection 0.5 mL (has no administration in time range)                                    Medical Decision Making Risk Prescription drug management.   33 year old female presents with laceration to the right knee.  Wound is fairly superficial although is gaping.  Wound was evaluated through full range of motion, entire depth, no visible foreign bodies.  Irrigated and closed with sutures.  Tetanus updated.  Discharged with instructions on care.     Final diagnoses:  Laceration of right lower extremity, initial encounter    ED Discharge Orders     None          Beverley Leita DELENA DEVONNA 04/08/24 2238    Jerral Meth, MD 04/08/24 2256
